# Patient Record
Sex: Male | Born: 1995
Health system: Southern US, Community
[De-identification: ages and names within clinical notes are randomized; demographics above are authoritative.]

## PROBLEM LIST (undated history)

## (undated) DIAGNOSIS — J019 Acute sinusitis, unspecified: Secondary | ICD-10-CM

## (undated) DIAGNOSIS — S83206A Unspecified tear of unspecified meniscus, current injury, right knee, initial encounter: Secondary | ICD-10-CM

## (undated) DIAGNOSIS — R0981 Nasal congestion: Secondary | ICD-10-CM

## (undated) DIAGNOSIS — IMO0001 Reserved for inherently not codable concepts without codable children: Secondary | ICD-10-CM

## (undated) DIAGNOSIS — R05 Cough: Secondary | ICD-10-CM

## (undated) DIAGNOSIS — K200 Eosinophilic esophagitis: Secondary | ICD-10-CM

## (undated) DIAGNOSIS — R058 Other specified cough: Secondary | ICD-10-CM

## (undated) DIAGNOSIS — K219 Gastro-esophageal reflux disease without esophagitis: Secondary | ICD-10-CM

## (undated) HISTORY — PX: CLEFT PALATE REPAIR: SUR1165

## (undated) HISTORY — PX: OTHER SURGICAL HISTORY: SHX169

---
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
## 2016-07-12 ENCOUNTER — Encounter (HOSPITAL_COMMUNITY): Payer: Self-pay | Admitting: Emergency Medicine

## 2016-07-12 ENCOUNTER — Emergency Department (HOSPITAL_COMMUNITY)
Admission: EM | Admit: 2016-07-12 | Discharge: 2016-07-13 | Disposition: A | Discharge status: Home / Self Care | Payer: Commercial Managed Care - HMO | Attending: Emergency Medicine | Admitting: Emergency Medicine

## 2016-07-12 DIAGNOSIS — Z79899 Other long term (current) drug therapy: Secondary | ICD-10-CM | POA: Diagnosis not present

## 2016-07-12 DIAGNOSIS — M79672 Pain in left foot: Secondary | ICD-10-CM | POA: Diagnosis present

## 2016-07-12 DIAGNOSIS — L03116 Cellulitis of left lower limb: Secondary | ICD-10-CM | POA: Insufficient documentation

## 2016-07-12 LAB — CBC WITH DIFFERENTIAL/PLATELET
Basophils Absolute: 0 10*3/uL (ref 0.0–0.1)
Basophils Relative: 0 %
EOS PCT: 2 %
Eosinophils Absolute: 0.2 10*3/uL (ref 0.0–0.7)
HEMATOCRIT: 45.4 % (ref 39.0–52.0)
HEMOGLOBIN: 15.9 g/dL (ref 13.0–17.0)
LYMPHS ABS: 1.1 10*3/uL (ref 0.7–4.0)
LYMPHS PCT: 8 %
MCH: 29.6 pg (ref 26.0–34.0)
MCHC: 35 g/dL (ref 30.0–36.0)
MCV: 84.5 fL (ref 78.0–100.0)
Monocytes Absolute: 0.7 10*3/uL (ref 0.1–1.0)
Monocytes Relative: 5 %
NEUTROS PCT: 85 %
Neutro Abs: 11.1 10*3/uL — ABNORMAL HIGH (ref 1.7–7.7)
Platelets: 215 10*3/uL (ref 150–400)
RBC: 5.37 MIL/uL (ref 4.22–5.81)
RDW: 12.4 % (ref 11.5–15.5)
WBC: 13.2 10*3/uL — AB (ref 4.0–10.5)

## 2016-07-12 LAB — BASIC METABOLIC PANEL
Anion gap: 10 (ref 5–15)
BUN: 11 mg/dL (ref 6–20)
CHLORIDE: 104 mmol/L (ref 101–111)
CO2: 29 mmol/L (ref 22–32)
Calcium: 9.8 mg/dL (ref 8.9–10.3)
Creatinine, Ser: 1.1 mg/dL (ref 0.61–1.24)
GFR calc Af Amer: >60 mL/min (ref >60)
GFR calc non Af Amer: >60 mL/min (ref >60)
GLUCOSE: 110 mg/dL — AB (ref 65–99)
POTASSIUM: 3.7 mmol/L (ref 3.5–5.1)
Sodium: 143 mmol/L (ref 135–145)

## 2016-07-12 MED ORDER — SODIUM CHLORIDE 0.9 % IV BOLUS (SEPSIS)
1000.0000 mL | Freq: Once | INTRAVENOUS | Status: AC
  Administered 2016-07-12: 1000 mL via INTRAVENOUS

## 2016-07-12 MED ORDER — METHYLPREDNISOLONE SODIUM SUCC 125 MG IJ SOLR
INTRAMUSCULAR | Status: AC
  Filled 2016-07-12: qty 2

## 2016-07-12 MED ORDER — DOXYCYCLINE HYCLATE 100 MG IV SOLR
200.0000 mg | Freq: Two times a day (BID) | INTRAVENOUS | Status: DC
  Administered 2016-07-12: 200 mg via INTRAVENOUS
  Filled 2016-07-12 (×2): qty 200

## 2016-07-12 MED ORDER — HYDROXYZINE HCL 50 MG/ML IM SOLN
INTRAMUSCULAR | Status: AC
  Filled 2016-07-12: qty 1

## 2016-07-12 MED ORDER — VANCOMYCIN HCL IN DEXTROSE 1-5 GM/200ML-% IV SOLN
1000.0000 mg | Freq: Once | INTRAVENOUS | Status: AC
  Administered 2016-07-12: 1000 mg via INTRAVENOUS
  Filled 2016-07-12: qty 200

## 2016-07-12 MED ORDER — FAMOTIDINE IN NACL 20-0.9 MG/50ML-% IV SOLN
INTRAVENOUS | Status: DC
Start: 2016-07-12 — End: 2016-07-13
  Filled 2016-07-12: qty 50

## 2016-07-12 MED ORDER — MORPHINE SULFATE (PF) 4 MG/ML IV SOLN
4.0000 mg | Freq: Once | INTRAVENOUS | Status: AC
  Administered 2016-07-12: 4 mg via INTRAVENOUS
  Filled 2016-07-12: qty 1

## 2016-07-12 MED ORDER — ONDANSETRON HCL 4 MG/2ML IJ SOLN
4.0000 mg | Freq: Once | INTRAMUSCULAR | Status: AC
  Administered 2016-07-12: 4 mg via INTRAVENOUS
  Filled 2016-07-12: qty 2

## 2016-07-12 MED ORDER — FAMOTIDINE IN NACL 20-0.9 MG/50ML-% IV SOLN
20.0000 mg | Freq: Once | INTRAVENOUS | Status: AC
  Administered 2016-07-12: 20 mg via INTRAVENOUS

## 2016-07-12 MED ORDER — METHYLPREDNISOLONE SODIUM SUCC 125 MG IJ SOLR
125.0000 mg | Freq: Once | INTRAMUSCULAR | Status: AC
  Administered 2016-07-12: 125 mg via INTRAVENOUS

## 2016-07-12 MED ORDER — DOXYCYCLINE HYCLATE 100 MG IV SOLR
INTRAVENOUS | Status: AC
  Filled 2016-07-12: qty 200

## 2016-07-12 MED ORDER — HYDROXYZINE HCL 50 MG/ML IM SOLN
50.0000 mg | Freq: Once | INTRAMUSCULAR | Status: AC
  Administered 2016-07-12: 50 mg via INTRAMUSCULAR

## 2016-07-12 NOTE — ED Notes (Signed)
Pt reported itching to head- this Clinical research associatewriter noted slight redness to face- spoke of need to watch and that sometimes Vanc would cause redness  Dr Adriana Simasook in to speak with pt and mother and noted redness to chest area- orders to stop Vanc, and ordered meds

## 2016-07-12 NOTE — ED Triage Notes (Signed)
Pt c/o infection to the left foot over one month. Pt also c/o swollen lymph nodes to the left groin with nausea and abd pain.

## 2016-07-12 NOTE — ED Notes (Signed)
Warm blanket given

## 2016-07-12 NOTE — ED Notes (Signed)
Received report on pt, pt updated on plan of care, denies any complaints, update given,

## 2016-07-13 MED ORDER — DOXYCYCLINE HYCLATE 100 MG PO CAPS: 100.0000 mg | ORAL_CAPSULE | Freq: Two times a day (BID) | ORAL | 0 refills | Status: DC

## 2016-07-13 NOTE — ED Notes (Signed)
No reaction noted to medication infusion, pt and family updated

## 2016-07-13 NOTE — ED Provider Notes (Signed)
[facility] Provider Note   CSN: [id number] Arrival date & time: 07/12/16  1936     History   Chief Complaint Chief Complaint  Patient presents with  . Recurrent Skin Infections    HPI Jonathan Mckay is a 21 y.o. male.  Pain in the plantar aspect of the left foot for several weeks, getting worse. 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 He senses a tenderness in his left inguinal area. No fever, sweats, chills. No history of diabetes. Severity of pain is moderate. He has tried nothing at home.      Past Medical History:  Diagnosis Date  . Eosinophilic esophagitis   . GERD (gastroesophageal reflux disease)   . History of Clostridium difficile    2012  . Nasal congestion   . Non-productive cough   . Right knee meniscal tear   . Sinusitis, acute     Patient Active Problem List   Diagnosis Date Noted  . S/P right knee arthroscopy 05/26/2013  . Eosinophilic esophagitis 09/08/2012  . Difficulty swallowing 09/05/2012  . Abdominal pain   . C. difficile diarrhea   . Blood in stool     Past Surgical History:  Procedure Laterality Date  . CLEFT PALATE REPAIR  age 89 mon old  &  416 mon old   and cleft lip repair  . DIRECT LARYNGOSCOPY  08-21-2006   W/  ESOPHAGOSCOPY AND BRONCHOSCOPY AND REMOVAL FORGEIN BODY  . ESOPHAGOGASTRODUODENOSCOPY N/A 06/20/2014   Procedure: ESOPHAGOGASTRODUODENOSCOPY (EGD);  Surgeon: Malissa HippoNajeeb U Rehman, MD;  [facility];  Service: Endoscopy;  Laterality: N/A;  200  . ESOPHAGOSCOPY N/A 09/05/2012   Procedure: ESOPHAGOSCOPY Foreign Body Removal;  Surgeon: Jon GillsJoseph H Clark, MD;  [facility];  Service: Gastroenterology;  Laterality: N/A;  . KNEE ARTHROSCOPY Right 05/26/2013   Procedure: ARTHROSCOPY RIGHT KNEE WITH DEBRIDEMENT  PARTIAL MENISECTOMY REMOVAL OF FOREIGN BODY ;  Surgeon: Eugenia Mcalpineobert Collins, MD;  [facility] [place];  Service: Orthopedics;  Laterality: Right;       Home Medications     Prior to Admission medications   Medication Sig Start Date End Date Taking? Authorizing Provider  EPIPEN 2-PAK 0.3 MG/0.3ML SOAJ injection Inject 0.3 mg into the muscle once.  07/15/14  Yes [provider]  doxycycline (VIBRAMYCIN) 100 MG capsule Take 1 capsule (100 mg total) by mouth 2 (two) times daily. 07/13/16   Donnetta Hutchingook, Romey Cohea, MD    Family History Family History  Problem Relation Age of Onset  . Stroke Other   . Asthma Other   . Cancer Other   . Inflammatory bowel disease Neg Hx     Social History Social History  Substance Use Topics  . Smoking status: Never Smoker  . Smokeless tobacco: Never Used  . Alcohol use No     Allergies   Amoxicillin; Penicillins; Shellfish allergy; Clindamycin/lincomycin; Vancomycin; and Benadryl [diphenhydramine hcl]   Review of Systems Review of Systems  All other systems reviewed and are negative.    Physical Exam Updated Vital Signs BP (!) 108/58   Pulse 64   Temp 97.8 F (36.6 C) (Oral)   Resp 16   Ht 6' (1.829 m)   Wt 75.3 kg (166 lb)   SpO2 98%   BMI 22.51 kg/m   Physical Exam  Constitutional: He is oriented to person, place, and time. He appears well-developed and well-nourished.  HENT:  Head: Normocephalic and atraumatic.  Eyes: Conjunctivae are normal.  Neck: Neck supple.  Cardiovascular: Normal rate and regular rhythm.   Pulmonary/Chest: Effort normal and breath sounds normal.  Abdominal: Soft. Bowel sounds are normal.  Musculoskeletal: Normal range of motion.  Neurological: He is alert and oriented to person, place, and time.  Skin:  Tender left inguinal node. Left foot: Area of cellulitis and superficial abscess on the plantar aspect of the foot around the am TP joint of the third,fourth, and fifth digit.  Psychiatric: He has a normal mood and affect. His behavior is normal.  Nursing note and vitals reviewed.    ED Treatments / Results  Labs (all labs ordered are listed, but only abnormal results  are displayed) Labs Reviewed  CBC WITH DIFFERENTIAL/PLATELET - Abnormal; Notable for the following:       Result Value   WBC 13.2 (*)    Neutro Abs 11.1 (*)    All other components within normal limits  BASIC METABOLIC PANEL - Abnormal; Notable for the following:    Glucose, Bld 110 (*)    All other components within normal limits    EKG  EKG Interpretation None       Radiology No results found.  Procedures .Marland KitchenIncision and Drainage Date/Time: 07/13/2016 3:53 PM Performed by: Donnetta Hutching Authorized by: Donnetta Hutching   Consent:    Consent obtained:  Verbal   Consent given by:  Patient   Risks discussed:  Pain Location:    Type:  Abscess   Location:  Lower extremity   Lower extremity location:  Foot   Foot location:  L foot Pre-procedure details:    Skin preparation:  Betadine Procedure type:    Complexity:  Simple Procedure details:    Needle aspiration: no     Incision types:  Stab incision   Incision depth:  Dermal   Scalpel blade:  11   Wound management:  Irrigated with saline and probed and deloculated   Drainage amount:  Scant   Wound treatment:  Wound left open   Packing materials:  None Post-procedure details:    Patient tolerance of procedure:  Tolerated well, no immediate complications Comments:     No pus expressed from wound.   (including critical care time)  Medications Ordered in ED Medications  vancomycin (VANCOCIN) IVPB 1000 mg/200 mL premix (0 mg Intravenous Stopped 07/12/16 2303)  sodium chloride 0.9 % bolus 1,000 mL (0 mLs Intravenous Stopped 07/13/16 0036)  ondansetron (ZOFRAN) injection 4 mg (4 mg Intravenous Given 07/12/16 2149)  morphine 4 MG/ML injection 4 mg (4 mg Intravenous Given 07/12/16 2147)  methylPREDNISolone sodium succinate (SOLU-MEDROL) 125 mg/2 mL injection 125 mg (125 mg Intravenous Given 07/12/16 2224)  famotidine (PEPCID) IVPB 20 mg premix (0 mg Intravenous Stopped 07/12/16 2312)  hydrOXYzine (VISTARIL) injection 50 mg (50 mg  Intramuscular Given 07/12/16 2227)     Initial Impression / Assessment and Plan / ED Course  I have reviewed the triage vital signs and the nursing notes.  Pertinent labs & imaging results that were available during my care of the patient were reviewed by me and considered in my medical decision making (see chart for details).    Patient has a cellulitis/abscess of his left foot with left inguinal adenopathy. He had an allergic phenomenon to the intravenous vancomycin. This was treated with IV steroids and IV Pepcid.  I then ordered intravenous doxycycline 200 mg. He tolerated this well. Discharge medications doxycycline 100 mg twice a day for 10 days. He understands to return for fever, chills, worsening pain, redness traveling up his  leg. This was discussed with the patient and his mother.   Final Clinical Impressions(s) / ED Diagnoses   Final diagnoses:  Cellulitis of foot, left    New Prescriptions Discharge Medication List as of 07/13/2016 12:12 AM    START taking these medications   Details  doxycycline (VIBRAMYCIN) 100 MG capsule Take 1 capsule (100 mg total) by mouth 2 (two) times daily., Starting Mon 07/13/2016, Print         Donnetta Hutching, MD 07/13/16 1556

## 2016-07-13 NOTE — Discharge Instructions (Signed)
Recommend waterproof boots.  Soak in warm salt water twice a day. Keep your feet very clean. White Socks. Elevate foot tomorrow. Prescription for antibiotic for 10 days.  Tylenol or ibuprofen for pain

## 2016-08-17 ENCOUNTER — Emergency Department (HOSPITAL_COMMUNITY)
Admission: EM | Admit: 2016-08-17 | Discharge: 2016-08-17 | Disposition: A | Discharge status: Home / Self Care | Payer: Commercial Managed Care - HMO | Attending: Emergency Medicine | Admitting: Emergency Medicine

## 2016-08-17 ENCOUNTER — Encounter (HOSPITAL_COMMUNITY): Payer: Self-pay | Admitting: Emergency Medicine

## 2016-08-17 DIAGNOSIS — R21 Rash and other nonspecific skin eruption: Secondary | ICD-10-CM

## 2016-08-17 DIAGNOSIS — L299 Pruritus, unspecified: Secondary | ICD-10-CM | POA: Diagnosis present

## 2016-08-17 DIAGNOSIS — J0181 Other acute recurrent sinusitis: Secondary | ICD-10-CM | POA: Insufficient documentation

## 2016-08-17 DIAGNOSIS — J0191 Acute recurrent sinusitis, unspecified: Secondary | ICD-10-CM

## 2016-08-17 MED ORDER — TRIAMCINOLONE ACETONIDE 0.1 % EX CREA: TOPICAL_CREAM | CUTANEOUS | 0 refills | Status: DC

## 2016-08-17 MED ORDER — DOXYCYCLINE HYCLATE 100 MG PO CAPS: 100.0000 mg | ORAL_CAPSULE | Freq: Two times a day (BID) | ORAL | 0 refills | Status: DC

## 2016-08-17 NOTE — ED Provider Notes (Signed)
[facility] Provider Note   CSN: [id number] Arrival date & time: 08/17/16  1837     History   Chief Complaint Chief Complaint  Patient presents with  . Wound Check    left foot    HPI Jonathan Mckay is a 21 y.o. male.   He is here for evaluation of multiple problems including sinus drainage, ear pain, sore in left axilla, itchy rash on legs, and a lesion on his left foot which has easily been treated.  He had an antibiotic prescription given about a month ago for an infection of his left foot.  He states it got a little better, but then the discomfort returned.  He denies fever, chills, nausea, vomiting, chest pain, shortness of breath, cough or sputum production.  He gets recurrent sinus infections, and is status post corrective surgery, for cleft palate.  There are no other known  modifying factors.   HPI  Past Medical History:  Diagnosis Date  . Eosinophilic esophagitis   . GERD (gastroesophageal reflux disease)   . History of Clostridium difficile    2012  . Nasal congestion   . Non-productive cough   . Right knee meniscal tear   . Sinusitis, acute     Patient Active Problem List   Diagnosis Date Noted  . S/P right knee arthroscopy 05/26/2013  . Eosinophilic esophagitis 09/08/2012  . Difficulty swallowing 09/05/2012  . Abdominal pain   . C. difficile diarrhea   . Blood in stool     Past Surgical History:  Procedure Laterality Date  . CLEFT PALATE REPAIR  age 53 mon old  &  42 mon old   and cleft lip repair  . DIRECT LARYNGOSCOPY  08-21-2006   W/  ESOPHAGOSCOPY AND BRONCHOSCOPY AND REMOVAL FORGEIN BODY  . ESOPHAGOGASTRODUODENOSCOPY N/A 06/20/2014   Procedure: ESOPHAGOGASTRODUODENOSCOPY (EGD);  Surgeon: Malissa Hippo, MD;  [facility];  Service: Endoscopy;  Laterality: N/A;  200  . ESOPHAGOSCOPY N/A 09/05/2012   Procedure: ESOPHAGOSCOPY Foreign Body Removal;  Surgeon: Jon Gills, MD;  Location: [facility] OR;  Service: Gastroenterology;   Laterality: N/A;  . KNEE ARTHROSCOPY Right 05/26/2013   Procedure: ARTHROSCOPY RIGHT KNEE WITH DEBRIDEMENT  PARTIAL MENISECTOMY REMOVAL OF FOREIGN BODY ;  Surgeon: Eugenia Mcalpine, MD;  Location: [facility];  Service: Orthopedics;  Laterality: Right;       Home Medications    Prior to Admission medications   Medication Sig Start Date End Date Taking? Authorizing Provider  doxycycline (VIBRAMYCIN) 100 MG capsule Take 1 capsule (100 mg total) by mouth 2 (two) times daily. 07/13/16   Donnetta Hutching, MD  EPIPEN 2-PAK 0.3 MG/0.3ML SOAJ injection Inject 0.3 mg into the muscle once.  07/15/14   [provider]    Family History Family History  Problem Relation Age of Onset  . Stroke Other   . Asthma Other   . Cancer Other   . Inflammatory bowel disease Neg Hx     Social History Social History  Substance Use Topics  . Smoking status: Never Smoker  . Smokeless tobacco: Never Used  . Alcohol use No     Allergies   Amoxicillin; Penicillins; Shellfish allergy; Clindamycin/lincomycin; Vancomycin; and Benadryl [diphenhydramine hcl]   Review of Systems Review of Systems  All other systems reviewed and are negative.    Physical Exam Updated Vital Signs BP 133/78 (BP Location: Right Arm)   Pulse 73   Temp 97.7 F (36.5 C) (Oral)   Resp  17   Ht 5\' 11"  (1.803 m)   Wt 72.6 kg (160 lb)   SpO2 99%   BMI 22.32 kg/m   Physical Exam  Constitutional: He is oriented to person, place, and time. He appears well-developed and well-nourished. No distress.  HENT:  Head: Normocephalic and atraumatic.  Right Ear: External ear normal.  Left Ear: External ear normal.  Sequela of cleft palate repair.  Visual abnormality of hard palate persists.  No drainage in oropharynx.  No drainage from nose.  TMs normal bilaterally.  Eyes: Pupils are equal, round, and reactive to light. Conjunctivae and EOM are normal.  Neck: Normal range of motion and phonation normal. Neck supple.    Cardiovascular: Normal rate, regular rhythm and normal heart sounds.   Pulmonary/Chest: Effort normal and breath sounds normal. He exhibits no bony tenderness.  Abdominal: Soft. There is no tenderness.  Musculoskeletal: Normal range of motion.  Neurological: He is alert and oriented to person, place, and time. No cranial nerve deficit or sensory deficit. He exhibits normal muscle tone. Coordination normal.  Skin: Skin is warm, dry and intact.  Firm induration left axilla, about 1.5 cm, consistent with full ankle.  Nonspecific rash anterior lower legs, characterized by tiny reddened areas, and excoriations.  Irregular mass left, plantar aspect forefoot, irregular, whitish discoloration, without drainage, bleeding or fluctuance.  Psychiatric: He has a normal mood and affect. His behavior is normal. Judgment and thought content normal.  Nursing note and vitals reviewed.    ED Treatments / Results  Labs (all labs ordered are listed, but only abnormal results are displayed) Labs Reviewed - No data to display  EKG  EKG Interpretation None       Radiology No results found.  Procedures Procedures (including critical care time)  Medications Ordered in ED Medications - No data to display   Initial Impression / Assessment and Plan / ED Course  I have reviewed the triage vital signs and the nursing notes.  Pertinent labs & imaging results that were available during my care of the patient were reviewed by me and considered in my medical decision making (see chart for details).      Patient Vitals for the past 24 hrs:  BP Temp Temp src Pulse Resp SpO2 Height Weight  08/17/16 1841 133/78 97.7 F (36.5 C) Oral 73 17 99 % 5\' 11"  (1.803 m) 72.6 kg (160 lb)    7:36 PM Reevaluation with update and discussion. After initial assessment and treatment, an updated evaluation reveals no change in clinical status.  Findings discussed with the patient and all questions were answered.  Annjanette Wertenberger L   Final Clinical Impressions(s) / ED Diagnoses   Final diagnoses:  None   Patient with multiple complaints, possible recurrent sinus infection, and associated left axilla infection.  No indication for drainage of the left axilla.  Nonspecific finding left foot, persistent for at least a month, etiology is not clear.  Possible infectious process.  This will require further evaluation by a podiatrist with possible biopsy.  It may be related to his chronic need for wearing boots, and working outside.  Nonspecific rash lower legs, may be related to poison ivy, or other nonspecific dermatitis.  Doubt serious bacterial infection or metabolic instability.  Nursing Notes Reviewed/ Care Coordinated Applicable Imaging Reviewed Interpretation of Laboratory Data incorporated into ED treatment  The patient appears reasonably screened and/or stabilized for discharge and I doubt any other medical condition or other [facility] requiring further screening, evaluation, or treatment in  the ED at this time prior to discharge.  Plan: Home Medications-OTC analgesia as needed; Home Treatments-skin cleansing to improve rash, and follicle left axilla; return here if the recommended treatment, does not improve the symptoms; Recommended follow up-PCP as needed.  Consider seeing podiatry for left foot problem.    New Prescriptions New Prescriptions   No medications on file     Mancel BaleWentz, Eliga Arvie, MD 08/17/16 1940

## 2016-08-17 NOTE — Discharge Instructions (Signed)
Follow-up with a primary care doctor for further treatment of the sinus infection and rash.  Consider seeing a podiatrist for the left foot problem.  It may help to get a biopsy of the area to determine exactly what it is caused by.

## 2016-08-20 ENCOUNTER — Encounter: Payer: Self-pay | Admitting: Podiatry

## 2016-08-20 ENCOUNTER — Ambulatory Visit (INDEPENDENT_AMBULATORY_CARE_PROVIDER_SITE_OTHER): Payer: 59 | Admitting: Podiatry

## 2016-08-20 DIAGNOSIS — B353 Tinea pedis: Secondary | ICD-10-CM

## 2016-08-20 MED ORDER — TERBINAFINE HCL 250 MG PO TABS: 250.0000 mg | ORAL_TABLET | Freq: Every day | ORAL | 1 refills | Status: DC

## 2016-08-20 NOTE — Progress Notes (Signed)
   Subjective:    Patient ID: Jonathan Mckay, male    DOB: [birth year], 21 y.o.   MRN: [id number]  HPI: He presents today with a chief complaint of painful plantar forefoot left he was referred by Dr. Adriana Simasook from the emergency department. He had blistered areas on the plantar aspect of the left foot for the past 2 months. He is an been been on 2 rounds of antibiotics. He saw his doctor due to severe leg and groin pain. He states that it itches and burns to refers to the plantar lesions. He decided that he does not want to have a biopsy was absolutely necessary he does relate a painful nodule to his armpit left. He denies any tick bite or any trauma. States that he has to work in [employer] creaks and latex etc.    Review of Systems  HENT: Positive for sinus pressure.   Skin: Positive for rash.  All other systems reviewed and are negative.      Objective:   Physical Exam: Vital signs stable alert and oriented 3. Pulses are palpable. Neurologic system is intact. Deep tendon reflexes are intact. Muscle strength is normal bilateral. Orthopedic evaluation was resolved just distal to the ankle range of motion without crepitation. Tenderness evaluation demonstrates what appears to be vesicular tinea pedis on the plantar aspect of the left foot appears to have started interdigitally and moved proximally. I imagine it was the rupturing of the blisters which she eventually become infected leading to the cellulitis and the groin pain and leg pain. This has resolved now with the doxycycline.        Assessment & Plan:  Assessment: Tinea pedis left foot.  Plan: Start him on a Lamisil regimen 250 mg tablets 1 every day for 1 month.

## 2016-09-01 ENCOUNTER — Telehealth: Payer: Self-pay | Admitting: *Deleted

## 2016-09-01 NOTE — Telephone Encounter (Signed)
Pt states he was seen about 2 weeks ago and given a medication, but now he has the rash on his hands. I told pt to continue the medication Terbinafine for his feet, he has not been on the medication for the full treatment, and to make an appt with his PCP or the facility that works best for him. Pt states he will get in with a doctor.

## 2016-09-10 ENCOUNTER — Ambulatory Visit (INDEPENDENT_AMBULATORY_CARE_PROVIDER_SITE_OTHER): Payer: 59 | Admitting: Podiatry

## 2016-09-10 ENCOUNTER — Encounter: Payer: Self-pay | Admitting: Podiatry

## 2016-09-10 DIAGNOSIS — B353 Tinea pedis: Secondary | ICD-10-CM

## 2016-09-12 NOTE — Progress Notes (Signed)
He presents today for follow-up of his athlete's foot. He states that it seems to be doing much better.  Objective: Vital signs are stable alert and oriented 3. Pulses are palpable.  Plantar aspect of the left foot appears to be healing very nicely everything appears to be drying up I see no signs of new athlete's foot at this point. Interdigital has gone on to heal were waiting for the dry skin to slough off the distal aspect of the foot.  Assessment: Resolving tinea pedis with Lamisil.  Plan: I will this young man to continue Lamisil for at least another month I also talked him about cleaning his shoe gear he understands that is amenable to a follow-up with me as needed.

## 2016-09-24 ENCOUNTER — Emergency Department (HOSPITAL_COMMUNITY): Payer: 59

## 2016-09-24 ENCOUNTER — Emergency Department (HOSPITAL_COMMUNITY)
Admission: EM | Admit: 2016-09-24 | Discharge: 2016-09-25 | Disposition: A | Discharge status: Home / Self Care | Payer: 59 | Attending: Emergency Medicine | Admitting: Emergency Medicine

## 2016-09-24 ENCOUNTER — Encounter (HOSPITAL_COMMUNITY): Payer: Self-pay | Admitting: Emergency Medicine

## 2016-09-24 DIAGNOSIS — R1084 Generalized abdominal pain: Secondary | ICD-10-CM | POA: Insufficient documentation

## 2016-09-24 DIAGNOSIS — Z79899 Other long term (current) drug therapy: Secondary | ICD-10-CM | POA: Insufficient documentation

## 2016-09-24 DIAGNOSIS — K5900 Constipation, unspecified: Secondary | ICD-10-CM | POA: Insufficient documentation

## 2016-09-24 LAB — COMPREHENSIVE METABOLIC PANEL
ALT: 9 U/L — AB (ref 17–63)
ANION GAP: 7 (ref 5–15)
AST: 18 U/L (ref 15–41)
Albumin: 4.7 g/dL (ref 3.5–5.0)
Alkaline Phosphatase: 58 U/L (ref 38–126)
BUN: 18 mg/dL (ref 6–20)
CHLORIDE: 103 mmol/L (ref 101–111)
CO2: 29 mmol/L (ref 22–32)
CREATININE: 1.13 mg/dL (ref 0.61–1.24)
Calcium: 9.5 mg/dL (ref 8.9–10.3)
Glucose, Bld: 93 mg/dL (ref 65–99)
POTASSIUM: 3.8 mmol/L (ref 3.5–5.1)
SODIUM: 139 mmol/L (ref 135–145)
Total Bilirubin: 0.9 mg/dL (ref 0.3–1.2)
Total Protein: 7.2 g/dL (ref 6.5–8.1)

## 2016-09-24 LAB — CBC WITH DIFFERENTIAL/PLATELET
Basophils Absolute: 0 10*3/uL (ref 0.0–0.1)
Basophils Relative: 1 %
EOS ABS: 0.5 10*3/uL (ref 0.0–0.7)
EOS PCT: 8 %
HCT: 44.1 % (ref 39.0–52.0)
Hemoglobin: 15.8 g/dL (ref 13.0–17.0)
LYMPHS ABS: 2.3 10*3/uL (ref 0.7–4.0)
Lymphocytes Relative: 37 %
MCH: 29.9 pg (ref 26.0–34.0)
MCHC: 35.8 g/dL (ref 30.0–36.0)
MCV: 83.5 fL (ref 78.0–100.0)
MONO ABS: 0.4 10*3/uL (ref 0.1–1.0)
Monocytes Relative: 6 %
Neutro Abs: 3.1 10*3/uL (ref 1.7–7.7)
Neutrophils Relative %: 48 %
PLATELETS: 224 10*3/uL (ref 150–400)
RBC: 5.28 MIL/uL (ref 4.22–5.81)
RDW: 12.2 % (ref 11.5–15.5)
WBC: 6.3 10*3/uL (ref 4.0–10.5)

## 2016-09-24 LAB — URINALYSIS, ROUTINE W REFLEX MICROSCOPIC
Bilirubin Urine: NEGATIVE
Glucose, UA: NEGATIVE mg/dL
HGB URINE DIPSTICK: NEGATIVE
Ketones, ur: NEGATIVE mg/dL
LEUKOCYTES UA: NEGATIVE
NITRITE: NEGATIVE
PROTEIN: NEGATIVE mg/dL
SPECIFIC GRAVITY, URINE: 1.026 (ref 1.005–1.030)
pH: 5 (ref 5.0–8.0)

## 2016-09-24 LAB — LIPASE, BLOOD: LIPASE: 25 U/L (ref 11–51)

## 2016-09-24 MED ORDER — IOPAMIDOL (ISOVUE-300) INJECTION 61%
INTRAVENOUS | Status: AC
  Filled 2016-09-24: qty 30

## 2016-09-24 MED ORDER — FLEET ENEMA 7-19 GM/118ML RE ENEM
1.0000 | ENEMA | Freq: Once | RECTAL | Status: AC
  Administered 2016-09-24: 1 via RECTAL

## 2016-09-24 MED ORDER — SODIUM CHLORIDE 0.9 % IV BOLUS (SEPSIS)
1000.0000 mL | Freq: Once | INTRAVENOUS | Status: AC
  Administered 2016-09-24: 1000 mL via INTRAVENOUS

## 2016-09-24 MED ORDER — ONDANSETRON HCL 4 MG/2ML IJ SOLN
4.0000 mg | Freq: Once | INTRAMUSCULAR | Status: DC
  Filled 2016-09-24: qty 2

## 2016-09-24 NOTE — ED Provider Notes (Signed)
[facility] Provider Note   CSN: [id number] Arrival date & time: 09/24/16  1937     History   Chief Complaint Chief Complaint  Patient presents with  . Abdominal Pain    HPI Jonathan Mckay is a 21 y.o. male.  Patient complains of generalized abdominal cramping for 3-4 days with no bowel movement. This is unusual for him. No previous abdominal surgery. He has been eating but then cramping afterwards.Severity of pain is moderate. Nothing makes symptoms better or worse.      Past Medical History:  Diagnosis Date  . Eosinophilic esophagitis   . GERD (gastroesophageal reflux disease)   . History of Clostridium difficile    2012  . Nasal congestion   . Non-productive cough   . Right knee meniscal tear   . Sinusitis, acute     Patient Active Problem List   Diagnosis Date Noted  . S/P right knee arthroscopy 05/26/2013  . Eosinophilic esophagitis 09/08/2012  . Difficulty swallowing 09/05/2012  . Abdominal pain   . C. difficile diarrhea   . Blood in stool     Past Surgical History:  Procedure Laterality Date  . CLEFT PALATE REPAIR  age 89 mon old  &  12 mon old   and cleft lip repair  . DIRECT LARYNGOSCOPY  08-21-2006   W/  ESOPHAGOSCOPY AND BRONCHOSCOPY AND REMOVAL FORGEIN BODY  . ESOPHAGOGASTRODUODENOSCOPY N/A 06/20/2014   Procedure: ESOPHAGOGASTRODUODENOSCOPY (EGD);  Surgeon: Malissa Hippo, MD;  Location[facility];  Service: Endoscopy;  Laterality: N/A;  200  . ESOPHAGOSCOPY N/A 09/05/2012   Procedure: ESOPHAGOSCOPY Foreign Body Removal;  Surgeon: Jon Gills, MD;  Location: [facility] OR;  Service: Gastroenterology;  Laterality: N/A;  . KNEE ARTHROSCOPY Right 05/26/2013   Procedure: ARTHROSCOPY RIGHT KNEE WITH DEBRIDEMENT  PARTIAL MENISECTOMY REMOVAL OF FOREIGN BODY ;  Surgeon: Eugenia Mcalpine, MD;  Location: [facility];  Service: Orthopedics;  Laterality: Right;       Home Medications    Prior to Admission medications   Medication Sig  Start Date End Date Taking? Authorizing Provider  terbinafine (LAMISIL) 250 MG tablet Take 1 tablet (250 mg total) by mouth daily. 08/20/16  Yes Hyatt, Max T, DPM    Family History Family History  Problem Relation Age of Onset  . Stroke Other   . Asthma Other   . Cancer Other   . Inflammatory bowel disease Neg Hx     Social History Social History  Substance Use Topics  . Smoking status: Never Smoker  . Smokeless tobacco: Never Used  . Alcohol use No     Allergies   Amoxicillin; Penicillins; Shellfish allergy; Clindamycin/lincomycin; Vancomycin; and Benadryl [diphenhydramine hcl]   Review of Systems Review of Systems  All other systems reviewed and are negative.    Physical Exam Updated Vital Signs BP 129/70 (BP Location: Right Arm)   Temp 97.8 F (36.6 C) (Oral)   Resp 16   Ht 5\' 11"  (1.803 m)   Wt 70.3 kg (155 lb)   SpO2 100%   BMI 21.62 kg/m   Physical Exam  Constitutional: He is oriented to person, place, and time. He appears well-developed and well-nourished.  HENT:  Head: Normocephalic and atraumatic.  Eyes: Conjunctivae are normal.  Neck: Neck supple.  Cardiovascular: Normal rate and regular rhythm.   Pulmonary/Chest: Effort normal and breath sounds normal.  Abdominal:  Minimal generalized abdominal tenderness  Genitourinary:  Genitourinary Comments: Rectal exam: No masses. No stool palpated.  Musculoskeletal:  Normal range of motion.  Neurological: He is alert and oriented to person, place, and time.  Skin: Skin is warm and dry.  Psychiatric: He has a normal mood and affect. His behavior is normal.  Nursing note and vitals reviewed.    ED Treatments / Results  Labs (all labs ordered are listed, but only abnormal results are displayed) Labs Reviewed  COMPREHENSIVE METABOLIC PANEL - Abnormal; Notable for the following:       Result Value   ALT 9 (*)    All other components within normal limits  CBC WITH DIFFERENTIAL/PLATELET  LIPASE, BLOOD    URINALYSIS, ROUTINE W REFLEX MICROSCOPIC    EKG  EKG Interpretation None       Radiology Dg Abdomen Acute W/chest  Result Date: 09/24/2016 CLINICAL DATA:  Constipation, nausea, lower abdominal pain for 2 days. History of esophagitis, Clostridium difficile. EXAM: DG ABDOMEN ACUTE W/ 1V CHEST COMPARISON:  Chest radiograph September 23, 2013 FINDINGS: Cardiomediastinal silhouette is normal. Lungs are clear, no pleural effusions. No pneumothorax. Soft tissue planes and included osseous structures are unremarkable. Stool distended rectum. Scattered small and large bowel air-fluid levels without bowel distention. No intra-abdominal mass effect, pathologic calcifications or free air. Soft tissue planes and included osseous structures are non-suspicious. IMPRESSION: Normal chest. Stool distended rectum, possible fecal impaction without bowel obstruction. Scattered bowel air-fluid levels seen with mild ileus and, enteritis. Electronically Signed   By: Awilda Metro M.D.   On: 09/24/2016 20:42    Procedures Fecal disimpaction Date/Time: 09/24/2016 11:28 PM Performed by: Donnetta Hutching Authorized by: Donnetta Hutching  Consent: Verbal consent obtained. Risks and benefits: risks, benefits and alternatives were discussed Consent given by: patient Patient understanding: patient states understanding of the procedure being performed Relevant documents: relevant documents present and verified Test results: test results available and properly labeled Imaging studies: imaging studies available Patient identity confirmed: verbally with patient Local anesthesia used: no  Anesthesia: Local anesthesia used: no  Sedation: Patient sedated: no Patient tolerance: Patient tolerated the procedure well with no immediate complications Comments: Digital rectal exam performed. No stool palpated. Fleets enema applied.    (including critical care time)  Medications Ordered in ED Medications  ondansetron (ZOFRAN)  injection 4 mg (4 mg Intravenous Refused 09/24/16 2100)  iopamidol (ISOVUE-300) 61 % injection (not administered)  sodium chloride 0.9 % bolus 1,000 mL (0 mLs Intravenous Stopped 09/24/16 2120)  sodium phosphate (FLEET) 7-19 GM/118ML enema 1 enema (1 enema Rectal Given by Other 09/24/16 2244)     Initial Impression / Assessment and Plan / ED Course  I have reviewed the triage vital signs and the nursing notes.  Pertinent labs & imaging results that were available during my care of the patient were reviewed by me and considered in my medical decision making (see chart for details).     Patient is not normally constipated. Rectal exam reveals no obvious fecal mass. Will obtain CT scan for completeness. Discussed with Dr. Lynelle Doctor  Final Clinical Impressions(s) / ED Diagnoses   Final diagnoses:  Generalized abdominal pain  Constipation, unspecified constipation type    New Prescriptions New Prescriptions   No medications on file     Donnetta Hutching, MD 09/24/16 2354

## 2016-09-24 NOTE — Discharge Instructions (Signed)
CT scan was negative. Recommended increase fluid, fruit, fiber. Over-the-counter products include magnesium citrate, Miralax, Dulcolax

## 2016-09-24 NOTE — ED Triage Notes (Signed)
Pt c/o generalized abd pain that gets worse after he eats and states he has not had a bowel movement in 3 days.

## 2016-09-24 NOTE — ED Notes (Signed)
Pt does not wish to take zofran at this time.

## 2016-09-24 NOTE — ED Provider Notes (Signed)
[facility] Provider Note   CSN: [id number] Arrival date & time: 09/24/16  1937     History   Chief Complaint Chief Complaint  Patient presents with  . Abdominal Pain    HPI Jonathan Mckay is a 21 y.o. male.  Patient presents with no bowel movement for several days with associated abdominal cramping. This is unusual for him. History over-the-counter medications with no relief. No vomiting, fever, sweats, chills.  Severity of symptoms is moderate. Nothing makes symptoms better or worse.      Past Medical History:  Diagnosis Date  . Eosinophilic esophagitis   . GERD (gastroesophageal reflux disease)   . History of Clostridium difficile    2012  . Nasal congestion   . Non-productive cough   . Right knee meniscal tear   . Sinusitis, acute     Patient Active Problem List   Diagnosis Date Noted  . S/P right knee arthroscopy 05/26/2013  . Eosinophilic esophagitis 09/08/2012  . Difficulty swallowing 09/05/2012  . Abdominal pain   . C. difficile diarrhea   . Blood in stool     Past Surgical History:  Procedure Laterality Date  . CLEFT PALATE REPAIR  age 29 mon old  &  69 mon old   and cleft lip repair  . DIRECT LARYNGOSCOPY  08-21-2006   W/  ESOPHAGOSCOPY AND BRONCHOSCOPY AND REMOVAL FORGEIN BODY  . ESOPHAGOGASTRODUODENOSCOPY N/A 06/20/2014   Procedure: ESOPHAGOGASTRODUODENOSCOPY (EGD);  Surgeon: Malissa Hippo, MD;  Location[facility];  Service: Endoscopy;  Laterality: N/A;  200  . ESOPHAGOSCOPY N/A 09/05/2012   Procedure: ESOPHAGOSCOPY Foreign Body Removal;  Surgeon: Jon Gills, MD;  Location: [facility] OR;  Service: Gastroenterology;  Laterality: N/A;  . KNEE ARTHROSCOPY Right 05/26/2013   Procedure: ARTHROSCOPY RIGHT KNEE WITH DEBRIDEMENT  PARTIAL MENISECTOMY REMOVAL OF FOREIGN BODY ;  Surgeon: Eugenia Mcalpine, MD;  Location: [facility];  Service: Orthopedics;  Laterality: Right;       Home Medications    Prior to Admission medications     Medication Sig Start Date End Date Taking? Authorizing Provider  terbinafine (LAMISIL) 250 MG tablet Take 1 tablet (250 mg total) by mouth daily. 08/20/16  Yes Hyatt, Max T, DPM    Family History Family History  Problem Relation Age of Onset  . Stroke Other   . Asthma Other   . Cancer Other   . Inflammatory bowel disease Neg Hx     Social History Social History  Substance Use Topics  . Smoking status: Never Smoker  . Smokeless tobacco: Never Used  . Alcohol use No     Allergies   Amoxicillin; Penicillins; Shellfish allergy; Clindamycin/lincomycin; Vancomycin; and Benadryl [diphenhydramine hcl]   Review of Systems Review of Systems  All other systems reviewed and are negative.    Physical Exam Updated Vital Signs BP 129/70 (BP Location: Right Arm)   Temp 97.8 F (36.6 C) (Oral)   Resp 16   Ht 5\' 11"  (1.803 m)   Wt 70.3 kg (155 lb)   SpO2 100%   BMI 21.62 kg/m   Physical Exam  Constitutional: He is oriented to person, place, and time. He appears well-developed and well-nourished.  HENT:  Head: Normocephalic and atraumatic.  Eyes: Conjunctivae are normal.  Neck: Neck supple.  Cardiovascular: Normal rate and regular rhythm.   Pulmonary/Chest: Effort normal and breath sounds normal.  Abdominal:  Minimal generalized abdominal tenderness.  Genitourinary:  Genitourinary Comments: Rectal exam performed: No masses or stool.  Fleets enema applied.  Musculoskeletal: Normal range of motion.  Neurological: He is alert and oriented to person, place, and time.  Skin: Skin is warm and dry.  Psychiatric: He has a normal mood and affect. His behavior is normal.  Nursing note and vitals reviewed.    ED Treatments / Results  Labs (all labs ordered are listed, but only abnormal results are displayed) Labs Reviewed  COMPREHENSIVE METABOLIC PANEL - Abnormal; Notable for the following:       Result Value   ALT 9 (*)    All other components within normal limits  CBC WITH  DIFFERENTIAL/PLATELET  LIPASE, BLOOD  URINALYSIS, ROUTINE W REFLEX MICROSCOPIC    EKG  EKG Interpretation None       Radiology Dg Abdomen Acute W/chest  Result Date: 09/24/2016 CLINICAL DATA:  Constipation, nausea, lower abdominal pain for 2 days. History of esophagitis, Clostridium difficile. EXAM: DG ABDOMEN ACUTE W/ 1V CHEST COMPARISON:  Chest radiograph September 23, 2013 FINDINGS: Cardiomediastinal silhouette is normal. Lungs are clear, no pleural effusions. No pneumothorax. Soft tissue planes and included osseous structures are unremarkable. Stool distended rectum. Scattered small and large bowel air-fluid levels without bowel distention. No intra-abdominal mass effect, pathologic calcifications or free air. Soft tissue planes and included osseous structures are non-suspicious. IMPRESSION: Normal chest. Stool distended rectum, possible fecal impaction without bowel obstruction. Scattered bowel air-fluid levels seen with mild ileus and, enteritis. Electronically Signed   By: Awilda Metro M.D.   On: 09/24/2016 20:42    Procedures Procedures (including critical care time)  Medications Ordered in ED Medications  ondansetron (ZOFRAN) injection 4 mg (4 mg Intravenous Refused 09/24/16 2100)  iopamidol (ISOVUE-300) 61 % injection (not administered)  sodium chloride 0.9 % bolus 1,000 mL (0 mLs Intravenous Stopped 09/24/16 2120)  sodium phosphate (FLEET) 7-19 GM/118ML enema 1 enema (1 enema Rectal Given by Other 09/24/16 2244)     Initial Impression / Assessment and Plan / ED Course  I have reviewed the triage vital signs and the nursing notes.  Pertinent labs & imaging results that were available during my care of the patient were reviewed by me and considered in my medical decision making (see chart for details).     No obvious acute abdomen. CT abdomen pelvis shows a diffusely fluid-filled: And only minimally formed stool distally. No obvious colonic wall thickening or pericolonic  edema.  Patient will be treated as an outpatient.  Final Clinical Impressions(s) / ED Diagnoses   Final diagnoses:  Generalized abdominal pain  Constipation, unspecified constipation type    New Prescriptions New Prescriptions   No medications on file     Donnetta Hutching, MD 10/01/16 1401

## 2016-09-25 NOTE — ED Notes (Signed)
Pt returned from ct,  

## 2016-09-25 NOTE — ED Provider Notes (Signed)
Patient presented with crampy abdominal pain and no bowel movement for 3 days. He told me he has had nausea without vomiting and since he's been in the ED he's had 3 liquid stools. He was left at change of shift to get results of his CT scan of his abdomen/pelvis. Patient states he's feeling better. He states he's having good urinary output. He states he does not feel dehydrated.  Results for orders placed or performed during the hospital encounter of 09/24/16  CBC with Differential  Result Value Ref Range   WBC 6.3 4.0 - 10.5 K/uL   RBC 5.28 4.22 - 5.81 MIL/uL   Hemoglobin 15.8 13.0 - 17.0 g/dL   HCT 40.9 81.1 - 91.4 %   MCV 83.5 78.0 - 100.0 fL   MCH 29.9 26.0 - 34.0 pg   MCHC 35.8 30.0 - 36.0 g/dL   RDW 78.2 95.6 - 21.3 %   Platelets 224 150 - 400 K/uL   Neutrophils Relative % 48 %   Neutro Abs 3.1 1.7 - 7.7 K/uL   Lymphocytes Relative 37 %   Lymphs Abs 2.3 0.7 - 4.0 K/uL   Monocytes Relative 6 %   Monocytes Absolute 0.4 0.1 - 1.0 K/uL   Eosinophils Relative 8 %   Eosinophils Absolute 0.5 0.0 - 0.7 K/uL   Basophils Relative 1 %   Basophils Absolute 0.0 0.0 - 0.1 K/uL  Comprehensive metabolic panel  Result Value Ref Range   Sodium 139 135 - 145 mmol/L   Potassium 3.8 3.5 - 5.1 mmol/L   Chloride 103 101 - 111 mmol/L   CO2 29 22 - 32 mmol/L   Glucose, Bld 93 65 - 99 mg/dL   BUN 18 6 - 20 mg/dL   Creatinine, Ser 0.86 0.61 - 1.24 mg/dL   Calcium 9.5 8.9 - 57.8 mg/dL   Total Protein 7.2 6.5 - 8.1 g/dL   Albumin 4.7 3.5 - 5.0 g/dL   AST 18 15 - 41 U/L   ALT 9 (L) 17 - 63 U/L   Alkaline Phosphatase 58 38 - 126 U/L   Total Bilirubin 0.9 0.3 - 1.2 mg/dL   GFR calc non Af Amer >60 >60 mL/min   GFR calc Af Amer >60 >60 mL/min   Anion gap 7 5 - 15  Lipase, blood  Result Value Ref Range   Lipase 25 11 - 51 U/L  Urinalysis, Routine w reflex microscopic  Result Value Ref Range   Color, Urine YELLOW YELLOW   APPearance CLEAR CLEAR   Specific Gravity, Urine 1.026 1.005 - 1.030    pH 5.0 5.0 - 8.0   Glucose, UA NEGATIVE NEGATIVE mg/dL   Hgb urine dipstick NEGATIVE NEGATIVE   Bilirubin Urine NEGATIVE NEGATIVE   Ketones, ur NEGATIVE NEGATIVE mg/dL   Protein, ur NEGATIVE NEGATIVE mg/dL   Nitrite NEGATIVE NEGATIVE   Leukocytes, UA NEGATIVE NEGATIVE   Laboratory interpretation all normal    Ct Abdomen Pelvis Wo Contrast  Result Date: 09/25/2016 CLINICAL DATA:  Generalized abdominal pain and cramping. Constipation. EXAM: CT ABDOMEN AND PELVIS WITHOUT CONTRAST TECHNIQUE: Multidetector CT imaging of the abdomen and pelvis was performed following the standard protocol without IV contrast. COMPARISON:  Abdominal radiographs yesterday. FINDINGS: Lower chest: Lung bases are clear. Hepatobiliary: No focal hepatic lesion allowing for lack contrast. Gallbladder is decompressed. No calcified stone. No biliary dilatation. Pancreas: No ductal dilatation or inflammation. Spleen: Upper normal in size spanning 13 cm craniocaudal. Adrenals/Urinary Tract: Normal adrenal glands. No hydronephrosis or perinephric  edema. Cyst in the upper right kidney measures 17 mm. Urinary bladder is physiologically distended. No bladder wall thickening. Stomach/Bowel: Stomach physiologically distended. No small bowel dilatation wall thickening or inflammation. Normal appendix. The colon is fluid-filled with air-fluid levels. Minimal formed stool in the rectum, otherwise no formed stool. No definite colonic wall thickening, adjacent fluid partially obscures evaluation. Adjacent fluid partially limits evaluation. No pericolonic edema. Normal appendix. Vascular/Lymphatic: Small mesenteric nodes, not enlarged by size criteria. No retroperitoneal adenopathy. Normal caliber abdominal aorta. Reproductive: Prostate is unremarkable. Other: No free air, free fluid, or intra-abdominal fluid collection. Musculoskeletal: Bilateral L5 pars interarticularis defects with grade 1 anterolisthesis of L5 on S1. There are no acute or  suspicious osseous abnormalities. IMPRESSION: 1. Diffusely fluid-filled colon, only minimal formed stool distally. No definite colonic wall thickening or pericolonic edema. Findings consistent with diarrheal process. No bowel obstruction. 2. Borderline splenomegaly. Electronically Signed   By: Rubye Oaks M.D.   On: 09/25/2016 01:28   Dg Abdomen Acute W/chest  Result Date: 09/24/2016 CLINICAL DATA:  Constipation, nausea, lower abdominal pain for 2 days. History of esophagitis, Clostridium difficile. EXAM: DG ABDOMEN ACUTE W/ 1V CHEST COMPARISON:  Chest radiograph September 23, 2013 FINDINGS: Cardiomediastinal silhouette is normal. Lungs are clear, no pleural effusions. No pneumothorax. Soft tissue planes and included osseous structures are unremarkable. Stool distended rectum. Scattered small and large bowel air-fluid levels without bowel distention. No intra-abdominal mass effect, pathologic calcifications or free air. Soft tissue planes and included osseous structures are non-suspicious. IMPRESSION: Normal chest. Stool distended rectum, possible fecal impaction without bowel obstruction. Scattered bowel air-fluid levels seen with mild ileus and, enteritis. Electronically Signed   By: Awilda Metro M.D.   On: 09/24/2016 20:42   Diagnoses that have been ruled out:  None  Diagnoses that are still under consideration:  None  Final diagnoses:  Generalized abdominal pain  Constipation, unspecified constipation type    Plan discharge  Devoria Albe, MD, Concha Pyo, MD 09/25/16 [phone]

## 2016-09-27 ENCOUNTER — Emergency Department (HOSPITAL_COMMUNITY): Payer: 59

## 2016-09-27 ENCOUNTER — Emergency Department (HOSPITAL_COMMUNITY)
Admission: EM | Admit: 2016-09-27 | Discharge: 2016-09-27 | Disposition: A | Discharge status: Home / Self Care | Payer: 59 | Attending: Emergency Medicine | Admitting: Emergency Medicine

## 2016-09-27 ENCOUNTER — Encounter (HOSPITAL_COMMUNITY): Payer: Self-pay | Admitting: *Deleted

## 2016-09-27 DIAGNOSIS — T07XXXA Unspecified multiple injuries, initial encounter: Secondary | ICD-10-CM

## 2016-09-27 DIAGNOSIS — Y999 Unspecified external cause status: Secondary | ICD-10-CM | POA: Insufficient documentation

## 2016-09-27 DIAGNOSIS — S299XXA Unspecified injury of thorax, initial encounter: Secondary | ICD-10-CM | POA: Diagnosis not present

## 2016-09-27 DIAGNOSIS — R52 Pain, unspecified: Secondary | ICD-10-CM

## 2016-09-27 DIAGNOSIS — S161XXA Strain of muscle, fascia and tendon at neck level, initial encounter: Secondary | ICD-10-CM | POA: Diagnosis not present

## 2016-09-27 DIAGNOSIS — Y929 Unspecified place or not applicable: Secondary | ICD-10-CM | POA: Insufficient documentation

## 2016-09-27 DIAGNOSIS — Z79899 Other long term (current) drug therapy: Secondary | ICD-10-CM | POA: Diagnosis not present

## 2016-09-27 DIAGNOSIS — Y939 Activity, unspecified: Secondary | ICD-10-CM | POA: Insufficient documentation

## 2016-09-27 DIAGNOSIS — S0990XA Unspecified injury of head, initial encounter: Secondary | ICD-10-CM | POA: Diagnosis present

## 2016-09-27 DIAGNOSIS — S86912A Strain of unspecified muscle(s) and tendon(s) at lower leg level, left leg, initial encounter: Secondary | ICD-10-CM | POA: Insufficient documentation

## 2016-09-27 DIAGNOSIS — Z23 Encounter for immunization: Secondary | ICD-10-CM | POA: Diagnosis not present

## 2016-09-27 DIAGNOSIS — S0993XA Unspecified injury of face, initial encounter: Secondary | ICD-10-CM

## 2016-09-27 DIAGNOSIS — T148XXA Other injury of unspecified body region, initial encounter: Secondary | ICD-10-CM | POA: Diagnosis not present

## 2016-09-27 DIAGNOSIS — S060X1A Concussion with loss of consciousness of 30 minutes or less, initial encounter: Secondary | ICD-10-CM | POA: Diagnosis not present

## 2016-09-27 DIAGNOSIS — S02601A Fracture of unspecified part of body of right mandible, initial encounter for closed fracture: Secondary | ICD-10-CM | POA: Diagnosis not present

## 2016-09-27 DIAGNOSIS — S838X2A Sprain of other specified parts of left knee, initial encounter: Secondary | ICD-10-CM

## 2016-09-27 DIAGNOSIS — S298XXA Other specified injuries of thorax, initial encounter: Secondary | ICD-10-CM

## 2016-09-27 MED ORDER — ONDANSETRON HCL 4 MG/2ML IJ SOLN
4.0000 mg | Freq: Once | INTRAMUSCULAR | Status: AC
Start: 2016-09-27 — End: 2016-09-27
  Administered 2016-09-27: 4 mg via INTRAVENOUS
  Filled 2016-09-27: qty 2

## 2016-09-27 MED ORDER — MORPHINE SULFATE (PF) 4 MG/ML IV SOLN
4.0000 mg | Freq: Once | INTRAVENOUS | Status: AC
  Administered 2016-09-27: 4 mg via INTRAVENOUS
  Filled 2016-09-27: qty 1

## 2016-09-27 MED ORDER — DEXTROSE 5 % IV SOLN
100.0000 mg | Freq: Once | INTRAVENOUS | Status: AC
  Administered 2016-09-27: 100 mg via INTRAVENOUS
  Filled 2016-09-27: qty 100

## 2016-09-27 MED ORDER — MORPHINE SULFATE (PF) 2 MG/ML IV SOLN
2.0000 mg | Freq: Once | INTRAVENOUS | Status: AC
  Administered 2016-09-27: 2 mg via INTRAVENOUS
  Filled 2016-09-27: qty 1

## 2016-09-27 MED ORDER — FENTANYL CITRATE (PF) 100 MCG/2ML IJ SOLN
50.0000 ug | Freq: Once | INTRAMUSCULAR | Status: AC
  Administered 2016-09-27: 50 ug via INTRAVENOUS
  Filled 2016-09-27: qty 2

## 2016-09-27 MED ORDER — TETANUS-DIPHTH-ACELL PERTUSSIS 5-2.5-18.5 LF-MCG/0.5 IM SUSP
0.5000 mL | Freq: Once | INTRAMUSCULAR | Status: AC
  Administered 2016-09-27: 0.5 mL via INTRAMUSCULAR
  Filled 2016-09-27: qty 0.5

## 2016-09-27 MED ORDER — ONDANSETRON HCL 4 MG/2ML IJ SOLN
4.0000 mg | Freq: Once | INTRAMUSCULAR | Status: AC
  Administered 2016-09-27: 4 mg via INTRAVENOUS
  Filled 2016-09-27: qty 2

## 2016-09-27 MED ORDER — DOXYCYCLINE HYCLATE 100 MG IV SOLR
INTRAVENOUS | Status: AC
  Filled 2016-09-27: qty 100

## 2016-09-27 NOTE — ED Notes (Signed)
No deformities noted pt is bleeding from what appears to be is lip
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
## 2016-09-27 NOTE — Discharge Instructions (Signed)
GO DIRECTLY TO [facility] TO SEE DR POWERS WITH ORAL SURGERY  [address] [place], [place] [address]
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
## 2016-09-27 NOTE — ED Notes (Signed)
Report called to [facility] ER charge nurse.

## 2016-09-27 NOTE — ED Provider Notes (Signed)
Patient understands that he will have to wait several hrs in the [facility] ER and may not have operative management until 8/27 Patient/family understand this option They will still go to [facility] immediately    Zadie Rhine, MD 09/27/16 [phone]

## 2016-12-24 ENCOUNTER — Emergency Department (HOSPITAL_COMMUNITY)
Admission: EM | Admit: 2016-12-24 | Discharge: 2016-12-24 | Disposition: A | Discharge status: Home / Self Care | Payer: 59 | Attending: Emergency Medicine | Admitting: Emergency Medicine

## 2016-12-24 ENCOUNTER — Encounter (HOSPITAL_COMMUNITY): Payer: Self-pay | Admitting: *Deleted

## 2016-12-24 ENCOUNTER — Other Ambulatory Visit: Payer: Self-pay

## 2016-12-24 DIAGNOSIS — Z79899 Other long term (current) drug therapy: Secondary | ICD-10-CM | POA: Diagnosis not present

## 2016-12-24 DIAGNOSIS — N341 Nonspecific urethritis: Secondary | ICD-10-CM | POA: Diagnosis not present

## 2016-12-24 DIAGNOSIS — R300 Dysuria: Secondary | ICD-10-CM | POA: Diagnosis present

## 2016-12-24 DIAGNOSIS — N342 Other urethritis: Secondary | ICD-10-CM

## 2016-12-24 LAB — URINALYSIS, ROUTINE W REFLEX MICROSCOPIC
BILIRUBIN URINE: NEGATIVE
Glucose, UA: NEGATIVE mg/dL
Hgb urine dipstick: NEGATIVE
KETONES UR: NEGATIVE mg/dL
LEUKOCYTES UA: NEGATIVE
Nitrite: NEGATIVE
PH: 6 (ref 5.0–8.0)
Protein, ur: NEGATIVE mg/dL
Specific Gravity, Urine: 1.02 (ref 1.005–1.030)

## 2016-12-24 MED ORDER — AZITHROMYCIN 250 MG PO TABS
1000.0000 mg | ORAL_TABLET | Freq: Once | ORAL | Status: AC
  Administered 2016-12-24: 1000 mg via ORAL
  Filled 2016-12-24: qty 4

## 2016-12-24 MED ORDER — CEFTRIAXONE SODIUM 250 MG IJ SOLR
250.0000 mg | Freq: Once | INTRAMUSCULAR | Status: AC
  Administered 2016-12-24: 250 mg via INTRAMUSCULAR
  Filled 2016-12-24: qty 250

## 2016-12-24 MED ORDER — ONDANSETRON 4 MG PO TBDP
4.0000 mg | ORAL_TABLET | Freq: Once | ORAL | Status: AC
  Administered 2016-12-24: 4 mg via ORAL
  Filled 2016-12-24: qty 1

## 2016-12-24 MED ORDER — STERILE WATER FOR INJECTION IJ SOLN
INTRAMUSCULAR | Status: AC
  Administered 2016-12-24: 2 mL
  Filled 2016-12-24: qty 10

## 2016-12-24 MED ORDER — METRONIDAZOLE 500 MG PO TABS
2000.0000 mg | ORAL_TABLET | Freq: Once | ORAL | Status: AC
  Administered 2016-12-24: 2000 mg via ORAL
  Filled 2016-12-24: qty 4

## 2016-12-24 NOTE — ED Triage Notes (Signed)
Pt reports several days of painful urination along with right side back pain and groin pain.

## 2016-12-24 NOTE — ED Provider Notes (Signed)
[facility] Provider Note   CSN: [id number] Arrival date & time: 12/24/16  1507     History   Chief Complaint Chief Complaint  Patient presents with  . Dysuria  . Back Pain    HPI Jonathan Mckay is a 21 y.o. male.  The history is provided by the patient.  Dysuria   This is a new problem. Episode onset: 4 days. The problem occurs intermittently. The problem has been gradually improving. The quality of the pain is described as burning. The pain is moderate. There has been no fever. He is sexually active. There is no history of pyelonephritis. Associated symptoms include flank pain. Pertinent negatives include no chills, no nausea, no vomiting, no discharge, no frequency and no hematuria. He has tried nothing for the symptoms. His past medical history does not include kidney stones or catheterization.    Past Medical History:  Diagnosis Date  . Eosinophilic esophagitis   . GERD (gastroesophageal reflux disease)   . History of Clostridium difficile    2012  . Nasal congestion   . Non-productive cough   . Right knee meniscal tear   . Sinusitis, acute     Patient Active Problem List   Diagnosis Date Noted  . S/P right knee arthroscopy 05/26/2013  . Eosinophilic esophagitis 09/08/2012  . Difficulty swallowing 09/05/2012  . Abdominal pain   . C. difficile diarrhea   . Blood in stool     Past Surgical History:  Procedure Laterality Date  . CLEFT PALATE REPAIR  age 89 mon old  &  89 mon old   and cleft lip repair  . DIRECT LARYNGOSCOPY  08-21-2006   W/  ESOPHAGOSCOPY AND BRONCHOSCOPY AND REMOVAL FORGEIN BODY  . ESOPHAGOGASTRODUODENOSCOPY N/A 06/20/2014   Procedure: ESOPHAGOGASTRODUODENOSCOPY (EGD);  Surgeon: Malissa HippoNajeeb U Rehman, MD;  [facility];  Service: Endoscopy;  Laterality: N/A;  200  . ESOPHAGOSCOPY N/A 09/05/2012   Procedure: ESOPHAGOSCOPY Foreign Body Removal;  Surgeon: Jon GillsJoseph H Clark, MD;  [facility];  Service:  Gastroenterology;  Laterality: N/A;  . KNEE ARTHROSCOPY Right 05/26/2013   Procedure: ARTHROSCOPY RIGHT KNEE WITH DEBRIDEMENT  PARTIAL MENISECTOMY REMOVAL OF FOREIGN BODY ;  Surgeon: Eugenia Mcalpineobert Collins, MD;  [facility];  Service: Orthopedics;  Laterality: Right;       Home Medications    Prior to Admission medications   Medication Sig Start Date End Date Taking? Authorizing Provider  terbinafine (LAMISIL) 250 MG tablet Take 1 tablet (250 mg total) by mouth daily. 08/20/16   Hyatt, Max T, DPM    Family History Family History  Problem Relation Age of Onset  . Stroke Other   . Asthma Other   . Cancer Other   . Inflammatory bowel disease Neg Hx     Social History Social History   Tobacco Use  . Smoking status: Never Smoker  . Smokeless tobacco: Never Used  Substance Use Topics  . Alcohol use: No  . Drug use: No     Allergies   Amoxicillin; Penicillins; Shellfish allergy; Clindamycin/lincomycin; Vancomycin; and Benadryl [diphenhydramine hcl]   Review of Systems Review of Systems  Constitutional: Negative for chills and fever.  HENT: Negative for ear pain and sore throat.   Eyes: Negative for pain and visual disturbance.  Respiratory: Negative for cough and shortness of breath.   Cardiovascular: Negative for chest pain and palpitations.  Gastrointestinal: Negative for abdominal pain, nausea and vomiting.  Genitourinary: Positive for dysuria and  flank pain. Negative for discharge, frequency, hematuria, penile pain and testicular pain.  Musculoskeletal: Negative for arthralgias and back pain.  Skin: Negative for color change and rash.  Neurological: Negative for seizures and syncope.  All other systems reviewed and are negative.    Physical Exam Updated Vital Signs BP 126/79   Pulse 65   Temp 98.1 F (36.7 C) (Oral)   Resp 18   SpO2 99%   Physical Exam  Constitutional: He appears well-developed and well-nourished.  HENT:  Head:  Normocephalic and atraumatic.  Eyes: Conjunctivae are normal.  Neck: Neck supple.  Cardiovascular: Normal rate and regular rhythm.  No murmur heard. Pulmonary/Chest: Effort normal and breath sounds normal. No respiratory distress.  Abdominal: Soft. He exhibits no distension. There is no tenderness. There is no rigidity, no rebound, no guarding, no CVA tenderness, no tenderness at McBurney's point and negative Murphy's sign.  Genitourinary: Testes normal and penis normal. Right testis shows no swelling and no tenderness. Left testis shows no swelling and no tenderness. Circumcised. No penile erythema or penile tenderness. No discharge found.  Musculoskeletal: He exhibits no edema.  Neurological: He is alert.  Skin: Skin is warm and dry.  Psychiatric: He has a normal mood and affect.  Nursing note and vitals reviewed.    ED Treatments / Results  Labs (all labs ordered are listed, but only abnormal results are displayed) Labs Reviewed  URINALYSIS, ROUTINE W REFLEX MICROSCOPIC  GC/CHLAMYDIA PROBE AMP ([place]) NOT [facility]    EKG  EKG Interpretation None       Radiology No results found.  Procedures Procedures (including critical care time)  Medications Ordered in ED Medications  azithromycin (ZITHROMAX) tablet 1,000 mg (1,000 mg Oral Given 12/24/16 1648)  cefTRIAXone (ROCEPHIN) injection 250 mg (250 mg Intramuscular Given 12/24/16 1648)  metroNIDAZOLE (FLAGYL) tablet 2,000 mg (2,000 mg Oral Given 12/24/16 1648)  ondansetron (ZOFRAN-ODT) disintegrating tablet 4 mg (4 mg Oral Given 12/24/16 1648)  sterile water (preservative free) injection (2 mLs  Given 12/24/16 1652)     Initial Impression / Assessment and Plan / ED Course  I have reviewed the triage vital signs and the nursing notes.  Pertinent labs & imaging results that were available during my care of the patient were reviewed by me and considered in my medical decision making (see chart for details).      21 year old male with no pertinent past medical history presenting with 4 days of dysuria and lower back pain.  Denies fevers.  Denies penile discharge but is sexually active with women.  No CVA tenderness.  Benign abdomen.  Penile and testicular exam normal.  Patient is worried about STDs as he has had sexual activity with multiple females.  UA negative for signs of UTI.  Low concern for nephrolithiasis as he has no CVA tenderness and no abdominal pain no hematuria at this time.  Patient would like to be tested and treated for STDs.  GC chlamydia swab sent.  Will treat with Rocephin as he has had Rocephin in the past even though he has a penicillin allergy, azithromycin and Flagyl.  Patient amenable with this plan.  Strict return precautions given.  Final Clinical Impressions(s) / ED Diagnoses   Final diagnoses:  Urethritis    ED Discharge Orders    None       Allyn Bertoni Italyhad, MD 12/24/16 1723    Melene PlanFloyd, Dan, DO 12/24/16 1734

## 2016-12-25 LAB — GC/CHLAMYDIA PROBE AMP (~~LOC~~) NOT AT ARMC
CHLAMYDIA, DNA PROBE: NEGATIVE
Neisseria Gonorrhea: NEGATIVE

## 2017-02-24 DIAGNOSIS — J069 Acute upper respiratory infection, unspecified: Secondary | ICD-10-CM | POA: Diagnosis not present

## 2017-04-24 DIAGNOSIS — S0081XA Abrasion of other part of head, initial encounter: Secondary | ICD-10-CM | POA: Diagnosis not present

## 2017-04-24 DIAGNOSIS — S0083XA Contusion of other part of head, initial encounter: Secondary | ICD-10-CM | POA: Diagnosis not present

## 2017-04-24 DIAGNOSIS — S01131A Puncture wound without foreign body of right eyelid and periocular area, initial encounter: Secondary | ICD-10-CM | POA: Diagnosis not present

## 2017-04-29 ENCOUNTER — Encounter (HOSPITAL_COMMUNITY): Payer: Self-pay

## 2017-04-29 ENCOUNTER — Emergency Department (HOSPITAL_COMMUNITY)
Admission: EM | Admit: 2017-04-29 | Discharge: 2017-04-29 | Disposition: A | Discharge status: Home / Self Care | Payer: 59 | Attending: Physician Assistant | Admitting: Physician Assistant

## 2017-04-29 ENCOUNTER — Other Ambulatory Visit: Payer: Self-pay

## 2017-04-29 DIAGNOSIS — Z79899 Other long term (current) drug therapy: Secondary | ICD-10-CM | POA: Diagnosis not present

## 2017-04-29 DIAGNOSIS — R300 Dysuria: Secondary | ICD-10-CM | POA: Diagnosis present

## 2017-04-29 DIAGNOSIS — Z113 Encounter for screening for infections with a predominantly sexual mode of transmission: Secondary | ICD-10-CM | POA: Diagnosis not present

## 2017-04-29 DIAGNOSIS — N342 Other urethritis: Secondary | ICD-10-CM | POA: Diagnosis not present

## 2017-04-29 MED ORDER — AZITHROMYCIN 250 MG PO TABS: 2000.0000 mg | ORAL_TABLET | Freq: Once | ORAL | Status: DC

## 2017-04-29 MED ORDER — CEFTRIAXONE SODIUM 250 MG IJ SOLR: 250.0000 mg | INTRAMUSCULAR | Status: DC

## 2017-04-29 MED ORDER — AZITHROMYCIN 250 MG PO TABS
1000.0000 mg | ORAL_TABLET | Freq: Once | ORAL | Status: AC
  Administered 2017-04-29: 1000 mg via ORAL
  Filled 2017-04-29: qty 4

## 2017-04-29 MED ORDER — CEFTRIAXONE SODIUM 250 MG IJ SOLR
250.0000 mg | Freq: Once | INTRAMUSCULAR | Status: AC
  Administered 2017-04-29: 250 mg via INTRAMUSCULAR
  Filled 2017-04-29: qty 250

## 2017-04-29 MED ORDER — GENTAMICIN SULFATE 40 MG/ML IJ SOLN: 240.0000 mg | Freq: Once | INTRAMUSCULAR | Status: DC

## 2017-04-29 NOTE — Discharge Instructions (Addendum)
You were not tested for all STDs today. Your gonorrhea and chlamydia tests are pending- if they are positive, you will receive a phone call. Refrain from sex until you have the results from a full STD screen. Please go to [facility] (Address: [address], [place], [place] [address] Phone: [phone]) or see the [facility] (Address: [address] Phone: [phone]) for full STD screening. Return to the emergency room for worsening of symptoms, fever, and vomiting. ° °

## 2017-04-29 NOTE — ED Provider Notes (Signed)
[facility] EMERGENCY DEPARTMENT Provider Note   CSN: [id number] Arrival date & time: 04/29/17  1804     History   Chief Complaint CC: Burning with urination  HPI   Blood pressure (!) 145/91, pulse 60, temperature 98 F (36.7 C), temperature source Oral, resp. rate 16, SpO2 99 %.  Jonathan Mckay is a 22 y.o. male complaining of pain with urination over the course of the last 3 days.  No rash, testicular pain or swelling, lower abdominal pain, urethral discharge.  States he would also like to be tested for STDs.  No prior history of UTIs.  Past Medical History:  Diagnosis Date  . Eosinophilic esophagitis   . GERD (gastroesophageal reflux disease)   . History of Clostridium difficile    2012  . Nasal congestion   . Non-productive cough   . Right knee meniscal tear   . Sinusitis, acute     Patient Active Problem List   Diagnosis Date Noted  . S/P right knee arthroscopy 05/26/2013  . Eosinophilic esophagitis 09/08/2012  . Difficulty swallowing 09/05/2012  . Abdominal pain   . C. difficile diarrhea   . Blood in stool     Past Surgical History:  Procedure Laterality Date  . CLEFT PALATE REPAIR  age 22 mon old  &  47 mon old   and cleft lip repair  . DIRECT LARYNGOSCOPY  08-21-2006   W/  ESOPHAGOSCOPY AND BRONCHOSCOPY AND REMOVAL FORGEIN BODY  . ESOPHAGOGASTRODUODENOSCOPY N/A 06/20/2014   Procedure: ESOPHAGOGASTRODUODENOSCOPY (EGD);  Surgeon: Malissa Hippo, MD;  [facility];  Service: Endoscopy;  Laterality: N/A;  200  . ESOPHAGOSCOPY N/A 09/05/2012   Procedure: ESOPHAGOSCOPY Foreign Body Removal;  Surgeon: Jon Gills, MD;  Location: [facility] OR;  Service: Gastroenterology;  Laterality: N/A;  . KNEE ARTHROSCOPY Right 05/26/2013   Procedure: ARTHROSCOPY RIGHT KNEE WITH DEBRIDEMENT  PARTIAL MENISECTOMY REMOVAL OF FOREIGN BODY ;  Surgeon: Eugenia Mcalpine, MD;  Location: [facility];  Service: Orthopedics;  Laterality: Right;         Home Medications    Prior to Admission medications   Medication Sig Start Date End Date Taking? Authorizing Provider  terbinafine (LAMISIL) 250 MG tablet Take 1 tablet (250 mg total) by mouth daily. 08/20/16   Hyatt, Max T, DPM    Family History Family History  Problem Relation Age of Onset  . Stroke Other   . Asthma Other   . Cancer Other   . Inflammatory bowel disease Neg Hx     Social History Social History   Tobacco Use  . Smoking status: Never Smoker  . Smokeless tobacco: Never Used  Substance Use Topics  . Alcohol use: No  . Drug use: No     Allergies   Amoxicillin; Penicillins; Shellfish allergy; Clindamycin/lincomycin; Vancomycin; and Benadryl [diphenhydramine hcl]   Review of Systems Review of Systems  A complete review of systems was obtained and all systems are negative except as noted in the HPI and PMH.   Physical Exam Updated Vital Signs BP (!) 145/91 (BP Location: Right Arm)   Pulse 60   Temp 98 F (36.7 C) (Oral)   Resp 16   SpO2 99%   Physical Exam  Constitutional: He is oriented to person, place, and time. He appears well-developed and well-nourished. No distress.  HENT:  Head: Normocephalic and atraumatic.  Mouth/Throat: Oropharynx is clear and moist.  Eyes: Pupils are equal, round, and reactive to light. Conjunctivae and EOM  are normal.  Neck: Normal range of motion.  Cardiovascular: Normal rate, regular rhythm and intact distal pulses.  Pulmonary/Chest: Effort normal and breath sounds normal. No stridor. No respiratory distress. He has no wheezes. He has no rales. He exhibits no tenderness.  Abdominal: Soft. He exhibits no distension. There is no tenderness.  Genitourinary: Penis normal.  Genitourinary Comments: GU exam is chaperoned by nurse: No rashes or lesions, patient is circumcised, no gross urethral discharge.  Musculoskeletal: Normal range of motion.  Neurological: He is alert and oriented to person, place, and time.   Skin: Capillary refill takes less than 2 seconds. He is not diaphoretic.  Psychiatric: He has a normal mood and affect.  Nursing note and vitals reviewed.    ED Treatments / Results  Labs (all labs ordered are listed, but only abnormal results are displayed) Labs Reviewed  RPR  HIV ANTIBODY (ROUTINE TESTING)  GC/CHLAMYDIA PROBE AMP ([place]) NOT AT [facility]    EKG None  Radiology No results found.  Procedures Procedures (including critical care time)  Medications Ordered in ED Medications  cefTRIAXone (ROCEPHIN) injection 250 mg (has no administration in time range)  azithromycin (ZITHROMAX) tablet 1,000 mg (has no administration in time range)     Initial Impression / Assessment and Plan / ED Course  I have reviewed the triage vital signs and the nursing notes.  Pertinent labs & imaging results that were available during my care of the patient were reviewed by me and considered in my medical decision making (see chart for details).     Vitals:   04/29/17 1836  BP: (!) 145/91  Pulse: 60  Resp: 16  Temp: 98 F (36.7 C)  TempSrc: Oral  SpO2: 99%    Medications  cefTRIAXone (ROCEPHIN) injection 250 mg (has no administration in time range)  azithromycin (ZITHROMAX) tablet 1,000 mg (has no administration in time range)    Jonathan Mckay is 22 y.o. male presenting with burning with urination.  No prior history of UTI.  Patient is concerned about STD, will treat prophylactically, testing pending.  She has had a anaphylactic reaction to penicillins however ED pharmacist states that he has had Rocephin in the past, will give typical treatment based on the history of no anaphylactic reaction to Rocephin.  Evaluation does not show pathology that would require ongoing emergent intervention or inpatient treatment. Pt is hemodynamically stable and mentating appropriately. Discussed findings and plan with patient/guardian, who agrees with care plan. All questions answered.  Return precautions discussed and outpatient follow up given.      Final Clinical Impressions(s) / ED Diagnoses   Final diagnoses:  Urethritis    ED Discharge Orders    None       Lynetta Mareisciotta, Mardella Laymanicole, PA-C 04/29/17 1958    Mackuen, Cindee Saltourteney Lyn, MD 04/30/17 2002

## 2017-04-29 NOTE — ED Triage Notes (Addendum)
Pt reports burning with urination x several day. PT reports dark urine, no foul odor. PT requesting std testing as well

## 2017-04-30 LAB — GC/CHLAMYDIA PROBE AMP (~~LOC~~) NOT AT ARMC
Chlamydia: NEGATIVE
Neisseria Gonorrhea: NEGATIVE

## 2017-04-30 LAB — HIV ANTIBODY (ROUTINE TESTING W REFLEX): HIV SCREEN 4TH GENERATION: NONREACTIVE

## 2017-04-30 LAB — RPR: RPR Ser Ql: NONREACTIVE

## 2017-11-01 ENCOUNTER — Ambulatory Visit (HOSPITAL_COMMUNITY)
Admission: EM | Admit: 2017-11-01 | Discharge: 2017-11-01 | Disposition: A | Discharge status: Home / Self Care | Payer: 59 | Attending: Internal Medicine | Admitting: Internal Medicine

## 2017-11-01 ENCOUNTER — Encounter (HOSPITAL_COMMUNITY): Payer: Self-pay | Admitting: Emergency Medicine

## 2017-11-01 DIAGNOSIS — J320 Chronic maxillary sinusitis: Secondary | ICD-10-CM

## 2017-11-01 DIAGNOSIS — H7292 Unspecified perforation of tympanic membrane, left ear: Secondary | ICD-10-CM | POA: Diagnosis not present

## 2017-11-01 DIAGNOSIS — H60313 Diffuse otitis externa, bilateral: Secondary | ICD-10-CM

## 2017-11-01 DIAGNOSIS — H6690 Otitis media, unspecified, unspecified ear: Secondary | ICD-10-CM

## 2017-11-01 MED ORDER — NEOMYCIN-POLYMYXIN-HC 3.5-10000-1 OT SUSP: 4.0000 [drp] | Freq: Three times a day (TID) | OTIC | 0 refills | Status: AC

## 2017-11-01 MED ORDER — SULFAMETHOXAZOLE-TRIMETHOPRIM 800-160 MG PO TABS: 1.0000 | ORAL_TABLET | Freq: Two times a day (BID) | ORAL | 0 refills | Status: AC

## 2017-11-01 NOTE — ED Triage Notes (Signed)
PT reports bilateral otalgia, sore throat, and some facial pain and tightness for 2 days

## 2017-11-01 NOTE — ED Provider Notes (Signed)
[facility]    CSN: [id number] Arrival date & time: 11/01/17  1922     History   Chief Complaint Chief Complaint  Patient presents with  . Otalgia  . Facial Pain    HPI BHAVIN MONJARAZ is a 22 y.o. male.   1- Onset of bilateral ear pain 2 days ago after using a Q tip. Did not see blood. 2- has been having purulent nose congestion x 2 days. Due to cleft palate he is prone to get sinusitis. Has not had fever or chills, but his lower back gets achy when he gets infections and this pm felt this way. States he is normally placed on Bactrim due to multiple allergies and this helps.      Past Medical History:  Diagnosis Date  . Eosinophilic esophagitis   . GERD (gastroesophageal reflux disease)   . History of Clostridium difficile    2012  . Nasal congestion   . Non-productive cough   . Right knee meniscal tear   . Sinusitis, acute     Patient Active Problem List   Diagnosis Date Noted  . S/P right knee arthroscopy 05/26/2013  . Eosinophilic esophagitis 09/08/2012  . Difficulty swallowing 09/05/2012  . Abdominal pain   . C. difficile diarrhea   . Blood in stool     Past Surgical History:  Procedure Laterality Date  . CLEFT PALATE REPAIR  age 11 mon old  &  56 mon old   and cleft lip repair  . DIRECT LARYNGOSCOPY  08-21-2006   W/  ESOPHAGOSCOPY AND BRONCHOSCOPY AND REMOVAL FORGEIN BODY  . ESOPHAGOGASTRODUODENOSCOPY N/A 06/20/2014   Procedure: ESOPHAGOGASTRODUODENOSCOPY (EGD);  Surgeon: Malissa Hippo, MD;  [facility];  Service: Endoscopy;  Laterality: N/A;  200  . ESOPHAGOSCOPY N/A 09/05/2012   Procedure: ESOPHAGOSCOPY Foreign Body Removal;  Surgeon: Jon Gills, MD;  Location: [facility] OR;  Service: Gastroenterology;  Laterality: N/A;  . KNEE ARTHROSCOPY Right 05/26/2013   Procedure: ARTHROSCOPY RIGHT KNEE WITH DEBRIDEMENT  PARTIAL MENISECTOMY REMOVAL OF FOREIGN BODY ;  Surgeon: Eugenia Mcalpine, MD;  Location: [facility];  Service:  Orthopedics;  Laterality: Right;       Home Medications    Prior to Admission medications   Medication Sig Start Date End Date Taking? Authorizing Provider  esomeprazole (NEXIUM) 20 MG capsule Take 20 mg by mouth daily at 12 noon.   Yes [provider]  neomycin-polymyxin-hydrocortisone (CORTISPORIN) 3.5-10000-1 OTIC suspension Place 4 drops into both ears 3 (three) times daily for 7 days. 11/01/17 11/08/17  Rodriguez-Southworth, Nettie Elm, PA-C  sulfamethoxazole-trimethoprim (BACTRIM DS,SEPTRA DS) 800-160 MG tablet Take 1 tablet by mouth 2 (two) times daily for 10 days. 11/01/17 11/11/17  Rodriguez-Southworth, Nettie Elm, PA-C    Family History Family History  Problem Relation Age of Onset  . Stroke Other   . Asthma Other   . Cancer Other   . Inflammatory bowel disease Neg Hx     Social History Social History   Tobacco Use  . Smoking status: Never Smoker  . Smokeless tobacco: Never Used  Substance Use Topics  . Alcohol use: No  . Drug use: No     Allergies   Amoxicillin; Penicillins; Shellfish allergy; Clindamycin/lincomycin; Vancomycin; and Benadryl [diphenhydramine hcl]   Review of Systems Review of Systems  Constitutional: Positive for fatigue. Negative for chills, diaphoresis and fever.  HENT: Positive for ear pain, postnasal drip, sinus pressure, sinus pain and sore throat. Negative for congestion, ear discharge, facial  swelling and trouble swallowing.        Face is painful  Eyes: Negative for discharge.  Respiratory: Negative for cough.   Cardiovascular: Negative for chest pain.  Musculoskeletal: Positive for back pain.  Skin: Negative for rash.  Hematological: Negative for adenopathy.     Physical Exam Triage Vital Signs ED Triage Vitals [11/01/17 2000]  Enc Vitals Group     BP 126/78     Pulse Rate 65     Resp 16     Temp 97.8 F (36.6 C)     Temp Source Oral     SpO2 98 %     Weight 150 lb (68 kg)     Height      Head Circumference       Peak Flow      Pain Score 5     Pain Loc      Pain Edu?      Excl. in GC?    No data found.  Updated Vital Signs BP 126/78   Pulse 65   Temp 97.8 F (36.6 C) (Oral)   Resp 16   Wt 150 lb (68 kg)   SpO2 98%   BMI 20.92 kg/m   Visual Acuity Right Eye Distance:   Left Eye Distance:   Bilateral Distance:    Right Eye Near:   Left Eye Near:    Bilateral Near:     Physical Exam  Constitutional: He is oriented to person, place, and time. He appears well-developed and well-nourished.  HENT:  Head: Normocephalic.  Both canals and TMs red, no perforations noted. Nose with lime yellow mucous bilaterally  Eyes: Conjunctivae are normal.  Neck: Normal range of motion. Neck supple.  Cardiovascular: Normal rate and regular rhythm.  No murmur heard. Pulmonary/Chest: Effort normal and breath sounds normal. He has no wheezes.  Musculoskeletal: Normal range of motion.  Neurological: He is alert and oriented to person, place, and time.  Skin: Skin is warm and dry. No rash noted.  Psychiatric: He has a normal mood and affect. His behavior is normal. Judgment and thought content normal.     UC Treatments / Results  Labs (all labs ordered are listed, but only abnormal results are displayed) Labs Reviewed - No data to display  EKG None  Radiology No results found.  Procedures Procedures (including critical care time)  Medications Ordered in UC Medications - No data to display  Initial Impression / Assessment and Plan / UC Course  I have reviewed the triage vital signs and the nursing notes. I placed him on cortisporin otic gtts and oral Bactrim DS as noted. FU prn if not improving in 72h,      Final Clinical Impressions(s) / UC Diagnoses   Final diagnoses:  Acute diffuse otitis externa of both ears  Acute otitis media, unspecified otitis media type  Chronic maxillary sinusitis   Discharge Instructions   None    ED Prescriptions    Medication Sig Dispense Auth.  Provider   neomycin-polymyxin-hydrocortisone (CORTISPORIN) 3.5-10000-1 OTIC suspension Place 4 drops into both ears 3 (three) times daily for 7 days. 15 mL Rodriguez-Southworth, Anasha Perfecto, PA-C   sulfamethoxazole-trimethoprim (BACTRIM DS,SEPTRA DS) 800-160 MG tablet Take 1 tablet by mouth 2 (two) times daily for 10 days. 20 tablet Rodriguez-Southworth, Nettie Elm, PA-C     Controlled Substance Prescriptions [place] Controlled Substance Registry consulted? no   Garey Ham, [place] 11/01/17 2044

## 2017-11-02 ENCOUNTER — Encounter (HOSPITAL_COMMUNITY): Payer: Self-pay

## 2017-11-02 ENCOUNTER — Other Ambulatory Visit: Payer: Self-pay

## 2017-11-02 ENCOUNTER — Emergency Department (HOSPITAL_COMMUNITY)
Admission: EM | Admit: 2017-11-02 | Discharge: 2017-11-02 | Disposition: A | Discharge status: Home / Self Care | Payer: 59 | Attending: Emergency Medicine | Admitting: Emergency Medicine

## 2017-11-02 DIAGNOSIS — H7292 Unspecified perforation of tympanic membrane, left ear: Secondary | ICD-10-CM

## 2017-11-02 DIAGNOSIS — Z79899 Other long term (current) drug therapy: Secondary | ICD-10-CM | POA: Diagnosis not present

## 2017-11-02 DIAGNOSIS — H9203 Otalgia, bilateral: Secondary | ICD-10-CM | POA: Diagnosis not present

## 2017-11-02 MED ORDER — HYDROCODONE-ACETAMINOPHEN 5-325 MG PO TABS
1.0000 | ORAL_TABLET | ORAL | 0 refills | Status: DC | PRN
Start: 2017-11-02 — End: 2020-07-14

## 2017-11-02 MED ORDER — OFLOXACIN 0.3 % OP SOLN: 10.0000 [drp] | Freq: Every day | OPHTHALMIC | 0 refills | Status: DC

## 2017-11-02 MED ORDER — OFLOXACIN 0.3 % OT SOLN: 10.0000 [drp] | Freq: Every day | OTIC | 0 refills | Status: DC

## 2017-11-02 MED ORDER — IBUPROFEN 800 MG PO TABS: 800.0000 mg | ORAL_TABLET | Freq: Four times a day (QID) | ORAL | 0 refills | Status: DC | PRN

## 2017-11-02 MED ORDER — OFLOXACIN 0.3 % OP SOLN
5.0000 [drp] | Freq: Every day | OPHTHALMIC | Status: DC
  Filled 2017-11-02: qty 5

## 2017-11-02 MED ORDER — OFLOXACIN 0.3 % OT SOLN: 10.0000 [drp] | Freq: Every day | OTIC | 0 refills | Status: AC

## 2017-11-02 MED ORDER — OFLOXACIN 0.3 % OP SOLN: 10.0000 [drp] | Freq: Every day | OPHTHALMIC | 0 refills | Status: AC

## 2017-11-02 NOTE — ED Provider Notes (Signed)
[facility] EMERGENCY DEPARTMENT Provider Note   CSN: [id number] Arrival date & time: 11/02/17  0112     History   Chief Complaint Chief Complaint  Patient presents with  . Otalgia    HPI Jonathan Mckay is a 22 y.o. male.  Patient presents to the emergency department for evaluation of ear pain.  Patient reports that he was seen in urgent care today for bilateral ear pain and sinus congestion.  He reports that he had his ears cleaned out and during the procedure he had onset of worsening, severe left ear pain.  Reports decreased hearing.     Past Medical History:  Diagnosis Date  . Eosinophilic esophagitis   . GERD (gastroesophageal reflux disease)   . History of Clostridium difficile    2012  . Nasal congestion   . Non-productive cough   . Right knee meniscal tear   . Sinusitis, acute     Patient Active Problem List   Diagnosis Date Noted  . S/P right knee arthroscopy 05/26/2013  . Eosinophilic esophagitis 09/08/2012  . Difficulty swallowing 09/05/2012  . Abdominal pain   . C. difficile diarrhea   . Blood in stool     Past Surgical History:  Procedure Laterality Date  . CLEFT PALATE REPAIR  age 51 mon old  &  47 mon old   and cleft lip repair  . DIRECT LARYNGOSCOPY  08-21-2006   W/  ESOPHAGOSCOPY AND BRONCHOSCOPY AND REMOVAL FORGEIN BODY  . ESOPHAGOGASTRODUODENOSCOPY N/A 06/20/2014   Procedure: ESOPHAGOGASTRODUODENOSCOPY (EGD);  Surgeon: Malissa Hippo, MD;  [facility];  Service: Endoscopy;  Laterality: N/A;  200  . ESOPHAGOSCOPY N/A 09/05/2012   Procedure: ESOPHAGOSCOPY Foreign Body Removal;  Surgeon: Jon Gills, MD;  Location: [facility] OR;  Service: Gastroenterology;  Laterality: N/A;  . KNEE ARTHROSCOPY Right 05/26/2013   Procedure: ARTHROSCOPY RIGHT KNEE WITH DEBRIDEMENT  PARTIAL MENISECTOMY REMOVAL OF FOREIGN BODY ;  Surgeon: Eugenia Mcalpine, MD;  Location: [facility];  Service: Orthopedics;  Laterality: Right;        Home  Medications    Prior to Admission medications   Medication Sig Start Date End Date Taking? Authorizing Provider  esomeprazole (NEXIUM) 20 MG capsule Take 20 mg by mouth daily at 12 noon.    [provider]  HYDROcodone-acetaminophen (NORCO/VICODIN) 5-325 MG tablet Take 1-2 tablets by mouth every 4 (four) hours as needed. 11/02/17   Gilda Crease, MD  ibuprofen (ADVIL,MOTRIN) 800 MG tablet Take 1 tablet (800 mg total) by mouth every 6 (six) hours as needed. 11/02/17   Pollina, Canary Brim, MD  neomycin-polymyxin-hydrocortisone (CORTISPORIN) 3.5-10000-1 OTIC suspension Place 4 drops into both ears 3 (three) times daily for 7 days. 11/01/17 11/08/17  Rodriguez-Southworth, Nettie Elm, PA-C  sulfamethoxazole-trimethoprim (BACTRIM DS,SEPTRA DS) 800-160 MG tablet Take 1 tablet by mouth 2 (two) times daily for 10 days. 11/01/17 11/11/17  Rodriguez-Southworth, Nettie Elm, PA-C    Family History Family History  Problem Relation Age of Onset  . Stroke Other   . Asthma Other   . Cancer Other   . Inflammatory bowel disease Neg Hx     Social History Social History   Tobacco Use  . Smoking status: Never Smoker  . Smokeless tobacco: Never Used  Substance Use Topics  . Alcohol use: No  . Drug use: No     Allergies   Amoxicillin; Penicillins; Shellfish allergy; Clindamycin/lincomycin; Vancomycin; and Benadryl [diphenhydramine hcl]   Review of Systems Review of Systems  HENT:  Positive for congestion and ear pain.   All other systems reviewed and are negative.    Physical Exam Updated Vital Signs BP (!) 136/106 (BP Location: Right Arm)   Pulse 62   Temp 98.7 F (37.1 C) (Oral)   Resp 18   SpO2 99%   Physical Exam  Constitutional: He is oriented to person, place, and time. He appears well-developed and well-nourished. No distress.  HENT:  Head: Normocephalic and atraumatic.  Right Ear: Hearing normal.  Left Ear: Hearing normal. There is swelling. Tympanic membrane is  perforated and erythematous.  Nose: Nose normal.  Mouth/Throat: Oropharynx is clear and moist and mucous membranes are normal.  Eyes: Pupils are equal, round, and reactive to light. Conjunctivae and EOM are normal.  Neck: Normal range of motion. Neck supple.  Cardiovascular: Regular rhythm, S1 normal and S2 normal. Exam reveals no gallop and no friction rub.  No murmur heard. Pulmonary/Chest: Effort normal and breath sounds normal. No respiratory distress. He exhibits no tenderness.  Abdominal: Soft. Normal appearance and bowel sounds are normal. There is no hepatosplenomegaly. There is no tenderness. There is no rebound, no guarding, no tenderness at McBurney's point and negative Murphy's sign. No hernia.  Musculoskeletal: Normal range of motion.  Neurological: He is alert and oriented to person, place, and time. He has normal strength. No cranial nerve deficit or sensory deficit. Coordination normal. GCS eye subscore is 4. GCS verbal subscore is 5. GCS motor subscore is 6.  Skin: Skin is warm, dry and intact. No rash noted. No cyanosis.  Psychiatric: He has a normal mood and affect. His speech is normal and behavior is normal. Thought content normal.  Nursing note and vitals reviewed.    ED Treatments / Results  Labs (all labs ordered are listed, but only abnormal results are displayed) Labs Reviewed - No data to display  EKG None  Radiology No results found.  Procedures Procedures (including critical care time)  Medications Ordered in ED Medications  ofloxacin (OCUFLOX) 0.3 % ophthalmic solution 5 drop (has no administration in time range)     Initial Impression / Assessment and Plan / ED Course  I have reviewed the triage vital signs and the nursing notes.  Pertinent labs & imaging results that were available during my care of the patient were reviewed by me and considered in my medical decision making (see chart for details).     Emanation reveals evidence of left  hepatic membrane rupture.  He was prescribed Bactrim and Cortisporin Otic at urgent care.  Will discontinue Cortisporin Otic, substitute ofloxacin.  Follow-up with ENT.  Given tympanic membrane rupture precautions.  Final Clinical Impressions(s) / ED Diagnoses   Final diagnoses:  Perforation of left tympanic membrane    ED Discharge Orders         Ordered    HYDROcodone-acetaminophen (NORCO/VICODIN) 5-325 MG tablet  Every 4 hours PRN     11/02/17 0135    ibuprofen (ADVIL,MOTRIN) 800 MG tablet  Every 6 hours PRN     11/02/17 0136           Gilda Crease, MD 11/02/17 [phone]

## 2017-11-02 NOTE — ED Triage Notes (Signed)
Ear pain since yesterday

## 2017-11-03 MED FILL — Hydrocodone-Acetaminophen Tab 5-325 MG: ORAL | Qty: 6 | Status: AC

## 2018-02-04 DIAGNOSIS — J0100 Acute maxillary sinusitis, unspecified: Secondary | ICD-10-CM | POA: Diagnosis not present

## 2018-02-28 DIAGNOSIS — J111 Influenza due to unidentified influenza virus with other respiratory manifestations: Secondary | ICD-10-CM | POA: Diagnosis not present

## 2018-02-28 DIAGNOSIS — J029 Acute pharyngitis, unspecified: Secondary | ICD-10-CM | POA: Diagnosis not present

## 2018-03-03 ENCOUNTER — Telehealth (HOSPITAL_COMMUNITY): Payer: Self-pay | Admitting: Emergency Medicine

## 2018-03-03 ENCOUNTER — Encounter (HOSPITAL_COMMUNITY): Payer: Self-pay | Admitting: Emergency Medicine

## 2018-03-03 ENCOUNTER — Other Ambulatory Visit: Payer: Self-pay

## 2018-03-03 ENCOUNTER — Ambulatory Visit (HOSPITAL_COMMUNITY)
Admission: EM | Admit: 2018-03-03 | Discharge: 2018-03-03 | Disposition: A | Discharge status: Home / Self Care | Payer: 59 | Attending: Family Medicine | Admitting: Family Medicine

## 2018-03-03 DIAGNOSIS — L0291 Cutaneous abscess, unspecified: Secondary | ICD-10-CM

## 2018-03-03 MED ORDER — DOXYCYCLINE HYCLATE 100 MG PO TABS: 100.0000 mg | ORAL_TABLET | Freq: Two times a day (BID) | ORAL | 0 refills | Status: DC

## 2018-03-03 NOTE — Discharge Instructions (Addendum)
Warm moist compresses every couple hours.

## 2018-03-03 NOTE — Telephone Encounter (Signed)
Pharmacy change

## 2018-03-03 NOTE — ED Provider Notes (Signed)
[facility]    CSN: [id number] Arrival date & time: 03/03/18  1312     History   Chief Complaint Chief Complaint  Patient presents with  . Abscess    HPI Jonathan Mckay is a 23 y.o. male.   Patient has abscess to right thigh.  History of the same.  Patient noticed this area today.  Established patient. Patient taking tamiflu this week.       Past Medical History:  Diagnosis Date  . Eosinophilic esophagitis   . GERD (gastroesophageal reflux disease)   . History of Clostridium difficile    2012  . Nasal congestion   . Non-productive cough   . Right knee meniscal tear   . Sinusitis, acute     Patient Active Problem List   Diagnosis Date Noted  . S/P right knee arthroscopy 05/26/2013  . Eosinophilic esophagitis 09/08/2012  . Difficulty swallowing 09/05/2012  . Abdominal pain   . C. difficile diarrhea   . Blood in stool     Past Surgical History:  Procedure Laterality Date  . CLEFT PALATE REPAIR  age 48 mon old  &  1 mon old   and cleft lip repair  . DIRECT LARYNGOSCOPY  08-21-2006   W/  ESOPHAGOSCOPY AND BRONCHOSCOPY AND REMOVAL FORGEIN BODY  . ESOPHAGOGASTRODUODENOSCOPY N/A 06/20/2014   Procedure: ESOPHAGOGASTRODUODENOSCOPY (EGD);  Surgeon: Malissa Hippo, MD;  [facility];  Service: Endoscopy;  Laterality: N/A;  200  . ESOPHAGOSCOPY N/A 09/05/2012   Procedure: ESOPHAGOSCOPY Foreign Body Removal;  Surgeon: Jon Gills, MD;  Location: [facility];  Service: Gastroenterology;  Laterality: N/A;  . KNEE ARTHROSCOPY Right 05/26/2013   Procedure: ARTHROSCOPY RIGHT KNEE WITH DEBRIDEMENT  PARTIAL MENISECTOMY REMOVAL OF FOREIGN BODY ;  Surgeon: Eugenia Mcalpine, MD;  Location: [facility];  Service: Orthopedics;  Laterality: Right;       Home Medications    Prior to Admission medications   Medication Sig Start Date End Date Taking? Authorizing Provider  acetaminophen (TYLENOL) 325 MG tablet Take 650 mg by mouth every 6 (six) hours as  needed.   Yes [provider]  oseltamivir (TAMIFLU) 75 MG capsule Take 75 mg by mouth.   Yes [provider]  doxycycline (VIBRA-TABS) 100 MG tablet Take 1 tablet (100 mg total) by mouth 2 (two) times daily. 03/03/18   Elvina Sidle, MD  esomeprazole (NEXIUM) 20 MG capsule Take 20 mg by mouth daily at 12 noon.    [provider]  HYDROcodone-acetaminophen (NORCO/VICODIN) 5-325 MG tablet Take 1-2 tablets by mouth every 4 (four) hours as needed. 11/02/17   Gilda Crease, MD  ibuprofen (ADVIL,MOTRIN) 800 MG tablet Take 1 tablet (800 mg total) by mouth every 6 (six) hours as needed. 11/02/17   Gilda Crease, MD    Family History Family History  Problem Relation Age of Onset  . Stroke Other   . Asthma Other   . Cancer Other   . Inflammatory bowel disease Neg Hx     Social History Social History   Tobacco Use  . Smoking status: Never Smoker  . Smokeless tobacco: Never Used  Substance Use Topics  . Alcohol use: No  . Drug use: No     Allergies   Amoxicillin; Penicillins; Shellfish allergy; Clindamycin/lincomycin; Vancomycin; and Benadryl [diphenhydramine hcl]   Review of Systems Review of Systems  Skin: Positive for wound.     Physical Exam Triage Vital Signs ED Triage Vitals  Enc Vitals  Group     BP 03/03/18 1406 123/72     Pulse Rate 03/03/18 1406 68     Resp 03/03/18 1406 16     Temp 03/03/18 1406 98.2 F (36.8 C)     Temp Source 03/03/18 1406 Temporal     SpO2 03/03/18 1406 98 %     Weight --      Height --      Head Circumference --      Peak Flow --      Pain Score 03/03/18 1402 3     Pain Loc --      Pain Edu? --      Excl. in GC? --    No data found.  Updated Vital Signs BP 123/72 (BP Location: Right Arm)   Pulse 68   Temp 98.2 F (36.8 C) (Temporal)   Resp 16   SpO2 98%    Physical Exam Vitals signs and nursing note reviewed.  Constitutional:      Appearance: Normal appearance. He is normal  weight.  Eyes:     Conjunctiva/sclera: Conjunctivae normal.  Neck:     Musculoskeletal: Normal range of motion and neck supple.  Pulmonary:     Effort: Pulmonary effort is normal.  Musculoskeletal: Normal range of motion.  Skin:    Findings: Abscess present.          Comments: 0.5 cm round abscess with fluctuance, appears to have previously opened  Neurological:     General: No focal deficit present.     Mental Status: He is alert.  Psychiatric:        Mood and Affect: Mood normal.      UC Treatments / Results  Labs (all labs ordered are listed, but only abnormal results are displayed) Labs Reviewed - No data to display  EKG None  Radiology No results found.  Procedures Procedures (including critical care time)  Medications Ordered in UC Medications - No data to display  Initial Impression / Assessment and Plan / UC Course  I have reviewed the triage vital signs and the nursing notes.  Pertinent labs & imaging results that were available during my care of the patient were reviewed by me and considered in my medical decision making (see chart for details).     Final Clinical Impressions(s) / UC Diagnoses   Final diagnoses:  Abscess     Discharge Instructions     Warm moist compresses every couple hours.    ED Prescriptions    Medication Sig Dispense Auth. Provider   doxycycline (VIBRA-TABS) 100 MG tablet Take 1 tablet (100 mg total) by mouth 2 (two) times daily. 20 tablet Elvina Sidle, MD     Controlled Substance Prescriptions [place] Controlled Substance Registry consulted? Not Applicable   Elvina Sidle, MD 03/03/18 1432

## 2018-03-03 NOTE — ED Triage Notes (Signed)
Patient has abscess to right thigh.  History of the same.  Patient noticed this area today Patient taking tamiflu this week.

## 2018-04-24 ENCOUNTER — Telehealth: Payer: 59 | Admitting: Physician Assistant

## 2018-04-24 ENCOUNTER — Encounter: Payer: Self-pay | Admitting: Physician Assistant

## 2018-04-24 DIAGNOSIS — L237 Allergic contact dermatitis due to plants, except food: Secondary | ICD-10-CM

## 2018-04-24 MED ORDER — PREDNISONE 5 MG PO TABS: 5.0000 mg | ORAL_TABLET | ORAL | 0 refills | Status: DC

## 2018-04-24 NOTE — Progress Notes (Signed)
E Visit for Rash  We are sorry that you are not feeling well. Here is how we plan to help!   I recommend you take Benadryl 25 mg - 50 mg every 4 hours to control the symptoms but if they last over 24 hours it is best that you see an office based provider for follow up.  Based of what you shared with me you have dermatitis due to exposure to poison oak. I have prescribed steroids to help with this.   Prednisone 5 mg daily for 6 days (see taper instructions below)  Directions for 6 day taper: Day 1: 2 tablets before breakfast, 1 after both lunch & dinner and 2 at bedtime Day 2: 1 tab before breakfast, 1 after both lunch & dinner and 2 at bedtime Day 3: 1 tab at each meal & 1 at bedtime Day 4: 1 tab at breakfast, 1 at lunch, 1 at bedtime Day 5: 1 tab at breakfast & 1 tab at bedtime Day 6: 1 tab at breakfast      HOME CARE:   Take cool showers and avoid direct sunlight.  Apply cool compress or wet dressings.  Take a bath in an oatmeal bath.  Sprinkle content of one Aveeno packet under running faucet with comfortably warm water.  Bathe for 15-20 minutes, 1-2 times daily.  Pat dry with a towel. Do not rub the rash.  Use hydrocortisone cream.  Take an antihistamine like Benadryl for widespread rashes that itch.  The adult dose of Benadryl is 25-50 mg by mouth 4 times daily.  Caution:  This type of medication may cause sleepiness.  Do not drink alcohol, drive, or operate dangerous machinery while taking antihistamines.  Do not take these medications if you have prostate enlargement.  Read package instructions thoroughly on all medications that you take.  GET HELP RIGHT AWAY IF:   Symptoms don't go away after treatment.  Severe itching that persists.  If you rash spreads or swells.  If you rash begins to smell.  If it blisters and opens or develops a yellow-brown crust.  You develop a fever.  You have a sore throat.  You become short of breath.  MAKE SURE  YOU:  Understand these instructions. Will watch your condition. Will get help right away if you are not doing well or get worse.  Thank you for choosing an e-visit. Your e-visit answers were reviewed by a board certified advanced clinical practitioner to complete your personal care plan. Depending upon the condition, your plan could have included both over the counter or prescription medications. Please review your pharmacy choice. Be sure that the pharmacy you have chosen is open so that you can pick up your prescription now.  If there is a problem you may message your provider in MyChart to have the prescription routed to another pharmacy. Your safety is important to [place]. If you have drug allergies check your prescription carefully.  For the next 24 hours, you can use MyChart to ask questions about today's visit, request a non-urgent call back, or ask for a work or school excuse from your e-visit provider. You will get an email in the next two days asking about your experience. I hope that your e-visit has been valuable and will speed your recovery.     I have spent 7 min in completion and review of this note- [facility]

## 2019-05-04 ENCOUNTER — Emergency Department (HOSPITAL_COMMUNITY): Payer: 59

## 2019-05-04 ENCOUNTER — Other Ambulatory Visit: Payer: Self-pay

## 2019-05-04 ENCOUNTER — Emergency Department (HOSPITAL_COMMUNITY)
Admission: EM | Admit: 2019-05-04 | Discharge: 2019-05-04 | Disposition: A | Discharge status: Home / Self Care | Payer: 59 | Attending: Emergency Medicine | Admitting: Emergency Medicine

## 2019-05-04 ENCOUNTER — Encounter (HOSPITAL_COMMUNITY): Payer: Self-pay | Admitting: Emergency Medicine

## 2019-05-04 DIAGNOSIS — J029 Acute pharyngitis, unspecified: Secondary | ICD-10-CM | POA: Insufficient documentation

## 2019-05-04 DIAGNOSIS — Z79899 Other long term (current) drug therapy: Secondary | ICD-10-CM | POA: Insufficient documentation

## 2019-05-04 DIAGNOSIS — Z20822 Contact with and (suspected) exposure to covid-19: Secondary | ICD-10-CM | POA: Insufficient documentation

## 2019-05-04 DIAGNOSIS — J028 Acute pharyngitis due to other specified organisms: Secondary | ICD-10-CM

## 2019-05-04 DIAGNOSIS — R079 Chest pain, unspecified: Secondary | ICD-10-CM | POA: Diagnosis not present

## 2019-05-04 MED ORDER — NAPROXEN 500 MG PO TABS: 500.0000 mg | ORAL_TABLET | Freq: Two times a day (BID) | ORAL | 0 refills | Status: DC

## 2019-05-04 MED ORDER — BENZONATATE 100 MG PO CAPS
200.0000 mg | ORAL_CAPSULE | Freq: Once | ORAL | Status: AC
  Administered 2019-05-04: 22:00:00 200 mg via ORAL
  Filled 2019-05-04: qty 2

## 2019-05-04 MED ORDER — BENZONATATE 100 MG PO CAPS: 100.0000 mg | ORAL_CAPSULE | Freq: Three times a day (TID) | ORAL | 0 refills | Status: DC

## 2019-05-04 MED ORDER — NAPROXEN 250 MG PO TABS
500.0000 mg | ORAL_TABLET | Freq: Once | ORAL | Status: AC
  Administered 2019-05-04: 500 mg via ORAL
  Filled 2019-05-04: qty 2

## 2019-05-04 NOTE — ED Provider Notes (Signed)
[facility] EMERGENCY DEPARTMENT Provider Note   CSN: [id number] Arrival date & time: 05/04/19  2015     History Chief Complaint  Patient presents with  . Sore Throat    body aches    Jonathan Mckay is a 24 y.o. male.  Patient is a 24 year old male with history of cleft palate presenting to the emergency department for cough, sore throat and chest discomfort which started about 3 or 4 days ago.  Patient reports that he had some pain which started in his right side of his back and progressed to his chest which feels like a burning and sharp sensation.  Reports associated cough and sore throat.  Reports that he is concerned he possibly could have Covid since he reports his mother had Covid a few months ago and had similar symptoms.  He is eating normally and drinking adequate fluids.  Denies fever, chills, nausea, vomiting.  He does report using a vape 2 or 3 days ago but thought this may be contributing to the chest pain so he stopped.        Past Medical History:  Diagnosis Date  . Eosinophilic esophagitis   . GERD (gastroesophageal reflux disease)   . History of Clostridium difficile    2012  . Nasal congestion   . Non-productive cough   . Right knee meniscal tear   . Sinusitis, acute     Patient Active Problem List   Diagnosis Date Noted  . S/P right knee arthroscopy 05/26/2013  . Eosinophilic esophagitis 95/10/3265  . Difficulty swallowing 09/05/2012  . Abdominal pain   . C. difficile diarrhea   . Blood in stool     Past Surgical History:  Procedure Laterality Date  . CLEFT PALATE REPAIR  age 76 mon old  &  78 mon old   and cleft lip repair  . DIRECT LARYNGOSCOPY  08-21-2006   W/  ESOPHAGOSCOPY AND BRONCHOSCOPY AND REMOVAL FORGEIN BODY  . ESOPHAGOGASTRODUODENOSCOPY N/A 06/20/2014   Procedure: ESOPHAGOGASTRODUODENOSCOPY (EGD);  Surgeon: Rogene Houston, MD;  [facility];  Service: Endoscopy;  Laterality: N/A;  200  . ESOPHAGOSCOPY N/A 09/05/2012   Procedure: ESOPHAGOSCOPY Foreign Body Removal;  Surgeon: Oletha Blend, MD;  Location: [place];  Service: Gastroenterology;  Laterality: N/A;  . KNEE ARTHROSCOPY Right 05/26/2013   Procedure: ARTHROSCOPY RIGHT KNEE WITH DEBRIDEMENT  PARTIAL MENISECTOMY REMOVAL OF FOREIGN BODY ;  Surgeon: Sydnee Cabal, MD;  Location: [place];  Service: Orthopedics;  Laterality: Right;       Family History  Problem Relation Age of Onset  . Stroke Other   . Asthma Other   . Cancer Other   . Inflammatory bowel disease Neg Hx     Social History   Tobacco Use  . Smoking status: Never Smoker  . Smokeless tobacco: Never Used  Substance Use Topics  . Alcohol use: No  . Drug use: No    Home Medications Prior to Admission medications   Medication Sig Start Date End Date Taking? Authorizing Provider  acetaminophen (TYLENOL) 325 MG tablet Take 650 mg by mouth every 6 (six) hours as needed.    [provider]  doxycycline (VIBRA-TABS) 100 MG tablet Take 1 tablet (100 mg total) by mouth 2 (two) times daily. 03/03/18   Robyn Haber, MD  esomeprazole (NEXIUM) 20 MG capsule Take 20 mg by mouth daily at 12 noon.    [provider]  HYDROcodone-acetaminophen (NORCO/VICODIN) 5-325 MG tablet Take 1-2 tablets by  mouth every 4 (four) hours as needed. 11/02/17   Gilda Crease, MD  ibuprofen (ADVIL,MOTRIN) 800 MG tablet Take 1 tablet (800 mg total) by mouth every 6 (six) hours as needed. 11/02/17   Gilda Crease, MD  oseltamivir (TAMIFLU) 75 MG capsule Take 75 mg by mouth.    [provider]  predniSONE (DELTASONE) 5 MG tablet Take 1 tablet (5 mg total) by mouth as directed. Take 6 pills today, decrease by one pill until done 04/24/18   Demetrio Lapping, PA-C    Allergies    Amoxicillin, Penicillins, Shellfish allergy, Clindamycin/lincomycin, Vancomycin, and Benadryl [diphenhydramine hcl]  Review of Systems   Review of Systems  Constitutional: Negative for  appetite change, chills and fever.  HENT: Positive for sore throat. Negative for congestion, ear pain, rhinorrhea, tinnitus, trouble swallowing and voice change.   Eyes: Negative for pain and visual disturbance.  Respiratory: Positive for cough and chest tightness. Negative for shortness of breath.   Cardiovascular: Positive for chest pain. Negative for palpitations and leg swelling.  Gastrointestinal: Negative for abdominal pain, diarrhea, nausea and vomiting.  Genitourinary: Negative for dysuria and hematuria.  Musculoskeletal: Negative for arthralgias and back pain.  Skin: Negative for color change and rash.  Neurological: Negative for seizures and syncope.  All other systems reviewed and are negative.   Physical Exam Updated Vital Signs BP 136/84 (BP Location: Left Arm)   Pulse 72   Temp 97.7 F (36.5 C) (Oral)   Resp 18   Ht 5\' 11"  (1.803 m)   Wt 68 kg   SpO2 99%   BMI 20.92 kg/m   Physical Exam Vitals and nursing note reviewed.  Constitutional:      General: He is not in acute distress.    Appearance: Normal appearance. He is well-developed. He is not ill-appearing, toxic-appearing or diaphoretic.  HENT:     Head: Normocephalic and atraumatic.     Mouth/Throat:     Mouth: Mucous membranes are moist.     Tonsils: No tonsillar exudate.     Comments: Cleft palate present, clear post nasal drip Eyes:     Conjunctiva/sclera: Conjunctivae normal.  Cardiovascular:     Rate and Rhythm: Normal rate and regular rhythm.  Pulmonary:     Effort: Pulmonary effort is normal.     Breath sounds: Normal breath sounds. No wheezing, rhonchi or rales.  Skin:    General: Skin is dry.  Neurological:     Mental Status: He is alert.  Psychiatric:        Mood and Affect: Mood normal.     ED Results / Procedures / Treatments   Labs (all labs ordered are listed, but only abnormal results are displayed) Labs Reviewed - No data to display  EKG None  Radiology No results  found.  Procedures Procedures (including critical care time)  Medications Ordered in ED Medications - No data to display  ED Course  I have reviewed the triage vital signs and the nursing notes.  Pertinent labs & imaging results that were available during my care of the patient were reviewed by me and considered in my medical decision making (see chart for details).    MDM Rules/Calculators/A&P                      Based on review of vitals, medical screening exam, lab work and/or imaging, there does not appear to be an acute, emergent etiology for the patient's symptoms. Counseled pt on  good return precautions and encouraged both PCP and ED follow-up as needed.  Prior to discharge, I also discussed incidental imaging findings with patient in detail and advised appropriate, recommended follow-up in detail.  Clinical Impression: 1. Pharyngitis due to other organism   2. Chest pain, unspecified type   3. Suspected COVID-19 virus infection     Disposition: Discharge  Prior to providing a prescription for a controlled substance, I independently reviewed the patient's recent prescription history on the [place] Controlled Substance Reporting System. The patient had no recent or regular prescriptions and was deemed appropriate for a brief, less than 3 day prescription of narcotic for acute analgesia.  This note was prepared with assistance of Conservation officer, historic buildings. Occasional wrong-word or sound-a-like substitutions may have occurred due to the inherent limitations of voice recognition software.  Final Clinical Impression(s) / ED Diagnoses Final diagnoses:  None    Rx / DC Orders ED Discharge Orders    None       Jeral Pinch 05/13/19 2035    Milagros Loll, MD 05/17/19 1252

## 2019-05-04 NOTE — Discharge Instructions (Signed)
Your chest xray and EKG were reassuring. Your covid test is pending. Thank you for allowing me to care for you today. Please return to the emergency department if you have new or worsening symptoms.

## 2019-05-04 NOTE — ED Triage Notes (Signed)
Patient states sore throat, body aches, patient states chest burns  when he takes a breath x 3 days.

## 2019-05-05 LAB — SARS CORONAVIRUS 2 (TAT 6-24 HRS): SARS Coronavirus 2: NEGATIVE

## 2019-08-17 ENCOUNTER — Ambulatory Visit (INDEPENDENT_AMBULATORY_CARE_PROVIDER_SITE_OTHER): Payer: 59 | Admitting: Gastroenterology

## 2019-09-09 ENCOUNTER — Other Ambulatory Visit: Payer: Self-pay

## 2019-09-09 ENCOUNTER — Encounter (HOSPITAL_COMMUNITY): Payer: Self-pay | Admitting: *Deleted

## 2019-09-09 ENCOUNTER — Emergency Department (HOSPITAL_COMMUNITY): Payer: 59

## 2019-09-09 ENCOUNTER — Emergency Department (HOSPITAL_COMMUNITY)
Admission: EM | Admit: 2019-09-09 | Discharge: 2019-09-09 | Disposition: A | Discharge status: Home / Self Care | Payer: 59 | Attending: Emergency Medicine | Admitting: Emergency Medicine

## 2019-09-09 DIAGNOSIS — U071 COVID-19: Secondary | ICD-10-CM | POA: Insufficient documentation

## 2019-09-09 DIAGNOSIS — J069 Acute upper respiratory infection, unspecified: Secondary | ICD-10-CM

## 2019-09-09 DIAGNOSIS — Z20822 Contact with and (suspected) exposure to covid-19: Secondary | ICD-10-CM

## 2019-09-09 DIAGNOSIS — R509 Fever, unspecified: Secondary | ICD-10-CM | POA: Diagnosis present

## 2019-09-09 LAB — GROUP A STREP BY PCR: Group A Strep by PCR: NOT DETECTED

## 2019-09-09 LAB — SARS CORONAVIRUS 2 BY RT PCR (HOSPITAL ORDER, PERFORMED IN ~~LOC~~ HOSPITAL LAB): SARS Coronavirus 2: POSITIVE — AB

## 2019-09-09 MED ORDER — ACETAMINOPHEN 325 MG PO TABS
650.0000 mg | ORAL_TABLET | Freq: Once | ORAL | Status: AC
  Administered 2019-09-09: 650 mg via ORAL
  Filled 2019-09-09: qty 2

## 2019-09-09 MED ORDER — BENZONATATE 100 MG PO CAPS
100.0000 mg | ORAL_CAPSULE | Freq: Three times a day (TID) | ORAL | 0 refills | Status: DC
Start: 2019-09-09 — End: 2020-07-14

## 2019-09-09 NOTE — ED Provider Notes (Addendum)
[facility] EMERGENCY DEPARTMENT Provider Note   CSN: [id number] Arrival date & time: 09/09/19  1221     History Chief Complaint  Patient presents with  . Fever  . Generalized Body Aches    Jonathan Mckay is a 24 y.o. male history of GERD, cleft palate.  Patient presents today for URI symptoms onset yesterday. He describes tactile fevers at home but has not measured temperature. Generalized body aches described as an mild aching sensation nonradiating no clear aggravating or alleviating factors. He has not taken any medication for his symptoms. He reports a mild cough which has been nonproductive since yesterday. He also reports mild sore throat a scratchy sensation when he swallows bilaterally. Associated symptom of loss of taste and smell.  Patient denies headache, vision changes, neck stiffness, difficulty swallowing, voice change, hemoptysis/shortness of breath, chest pain, nausea/vomiting, diarrhea, extremity swelling/color change or any additional concerns.  Patient reports that he has not had the COVID-19 vaccine and does not know of any exposures.  HPI     Past Medical History:  Diagnosis Date  . Eosinophilic esophagitis   . GERD (gastroesophageal reflux disease)   . History of Clostridium difficile    2012  . Nasal congestion   . Non-productive cough   . Right knee meniscal tear   . Sinusitis, acute     Patient Active Problem List   Diagnosis Date Noted  . S/P right knee arthroscopy 05/26/2013  . Eosinophilic esophagitis 09/08/2012  . Difficulty swallowing 09/05/2012  . Abdominal pain   . C. difficile diarrhea   . Blood in stool     Past Surgical History:  Procedure Laterality Date  . CLEFT PALATE REPAIR  age 78 mon old  &  21 mon old   and cleft lip repair  . DIRECT LARYNGOSCOPY  08-21-2006   W/  ESOPHAGOSCOPY AND BRONCHOSCOPY AND REMOVAL FORGEIN BODY  . ESOPHAGOGASTRODUODENOSCOPY N/A 06/20/2014   Procedure: ESOPHAGOGASTRODUODENOSCOPY (EGD);  Surgeon:  Malissa Hippo, MD;  Location: [facility];  Service: Endoscopy;  Laterality: N/A;  200  . ESOPHAGOSCOPY N/A 09/05/2012   Procedure: ESOPHAGOSCOPY Foreign Body Removal;  Surgeon: Jon Gills, MD;  Location: [facility] OR;  Service: Gastroenterology;  Laterality: N/A;  . KNEE ARTHROSCOPY Right 05/26/2013   Procedure: ARTHROSCOPY RIGHT KNEE WITH DEBRIDEMENT  PARTIAL MENISECTOMY REMOVAL OF FOREIGN BODY ;  Surgeon: Eugenia Mcalpine, MD;  Location: [facility];  Service: Orthopedics;  Laterality: Right;       Family History  Problem Relation Age of Onset  . Stroke Other   . Asthma Other   . Cancer Other   . Inflammatory bowel disease Neg Hx     Social History   Tobacco Use  . Smoking status: Never Smoker  . Smokeless tobacco: Never Used  Substance Use Topics  . Alcohol use: No  . Drug use: No    Home Medications Prior to Admission medications   Medication Sig Start Date End Date Taking? Authorizing Provider  acetaminophen (TYLENOL) 325 MG tablet Take 650 mg by mouth every 6 (six) hours as needed.    [provider]  benzonatate (TESSALON) 100 MG capsule Take 1 capsule (100 mg total) by mouth every 8 (eight) hours. 09/09/19   Harlene Salts A, PA-C  doxycycline (VIBRA-TABS) 100 MG tablet Take 1 tablet (100 mg total) by mouth 2 (two) times daily. 03/03/18   Elvina Sidle, MD  esomeprazole (NEXIUM) 20 MG capsule Take 20 mg by mouth daily at 12 noon.  [provider]  HYDROcodone-acetaminophen (NORCO/VICODIN) 5-325 MG tablet Take 1-2 tablets by mouth every 4 (four) hours as needed. 11/02/17   Gilda Crease, MD  ibuprofen (ADVIL,MOTRIN) 800 MG tablet Take 1 tablet (800 mg total) by mouth every 6 (six) hours as needed. 11/02/17   Gilda Crease, MD  naproxen (NAPROSYN) 500 MG tablet Take 1 tablet (500 mg total) by mouth 2 (two) times daily. 05/04/19   Arlyn Dunning, PA-C  oseltamivir (TAMIFLU) 75 MG capsule Take 75 mg by mouth.    [provider]  predniSONE (DELTASONE) 5 MG tablet Take 1 tablet (5 mg total) by mouth as directed. Take 6 pills today, decrease by one pill until done 04/24/18   Demetrio Lapping, PA-C    Allergies    Amoxicillin, Penicillins, Shellfish allergy, Clindamycin/lincomycin, Vancomycin, and Benadryl [diphenhydramine hcl]  Review of Systems   Review of Systems Ten systems are reviewed and are negative for acute change except as noted in the HPI Physical Exam Updated Vital Signs BP 135/71 (BP Location: Right Arm)   Pulse (!) 102   Temp 100.1 F (37.8 C) (Oral)   Resp 18   Ht 5\' 11"  (1.803 m)   Wt 72.6 kg   SpO2 96%   BMI 22.32 kg/m   Physical Exam Constitutional:      General: He is not in acute distress.    Appearance: Normal appearance. He is well-developed. He is not ill-appearing or diaphoretic.  HENT:     Head: Normocephalic and atraumatic.     Jaw: There is normal jaw occlusion.     Right Ear: Tympanic membrane and external ear normal.     Left Ear: Tympanic membrane and external ear normal.     Nose: Rhinorrhea present. Rhinorrhea is clear.     Right Sinus: No maxillary sinus tenderness or frontal sinus tenderness.     Left Sinus: No maxillary sinus tenderness or frontal sinus tenderness.     Mouth/Throat:     Comments: Cleft Palate. Mild postnasal drip. The patient has normal phonation and is in control of secretions. No stridor.  Midline uvula without edema. Soft palate rises symmetrically. No tonsillar erythema, swelling or exudates. Tongue protrusion is normal, floor of mouth is soft. No trismus. No creptius on neck palpation. No gingival erythema or fluctuance noted. Mucus membranes moist. No pallor noted. Eyes:     General: Vision grossly intact. Gaze aligned appropriately.     Extraocular Movements: Extraocular movements intact.     Pupils: Pupils are equal, round, and reactive to light.  Neck:     Trachea: Trachea and phonation normal. No tracheal tenderness or tracheal  deviation.     Meningeal: Brudzinski's sign absent.  Cardiovascular:     Rate and Rhythm: Normal rate and regular rhythm.     Pulses: Normal pulses.  Pulmonary:     Effort: Pulmonary effort is normal. No respiratory distress.     Breath sounds: Normal breath sounds.  Abdominal:     General: There is no distension.     Palpations: Abdomen is soft.     Tenderness: There is no abdominal tenderness. There is no guarding or rebound.  Musculoskeletal:        General: Normal range of motion.     Cervical back: Normal range of motion and neck supple.  Skin:    General: Skin is warm and dry.  Neurological:     Mental Status: He is alert.     GCS: GCS  eye subscore is 4. GCS verbal subscore is 5. GCS motor subscore is 6.     Comments: Speech is clear and goal oriented, follows commands Major Cranial nerves without deficit, no facial droop Moves extremities without ataxia, coordination intact  Psychiatric:        Behavior: Behavior normal.     ED Results / Procedures / Treatments   Labs (all labs ordered are listed, but only abnormal results are displayed) Labs Reviewed  GROUP A STREP BY PCR  SARS CORONAVIRUS 2 BY RT PCR (HOSPITAL ORDER[facility])    EKG None  Radiology DG Chest Portable 1 View  Result Date: 09/09/2019 CLINICAL DATA:  Fever and cough EXAM: PORTABLE CHEST 1 VIEW COMPARISON:  None. FINDINGS: Normal mediastinum and cardiac silhouette. Normal pulmonary vasculature. No evidence of effusion, infiltrate, or pneumothorax. No acute bony abnormality. IMPRESSION: Normal chest radiograph. Electronically Signed   By: Genevive BiStewart  Edmunds M.D.   On: 09/09/2019 14:14    Procedures Procedures (including critical care time)  Medications Ordered in ED Medications  acetaminophen (TYLENOL) tablet 650 mg (650 mg Oral Given 09/09/19 1424)    ED Course  I have reviewed the triage vital signs and the nursing notes.  Pertinent labs & imaging results that were  available during my care of the patient were reviewed by me and considered in my medical decision making (see chart for details).    MDM Rules/Calculators/A&P                         Additional history obtained from: 1. Nursing notes from this visit. -------- 24 year old male history as above presents today for URI symptoms onset yesterday. He has not had his Covid vaccine but has no known exposures. On examination he is well-appearing no acute distress vital signs within normal limits. Cranial nerves intact, no meningeal signs. TMs clear bilaterally. Airway is intact mild postnasal drip without evidence of PTA, RPA, Ludwick's or other deep space infections of the head/neck. Cardiopulmonary examination within normal limits. Abdomen soft nontender without peritoneal signs. Neurovascular intact to all 4 extremities without evidence of DVT. Triage vital signs show heart rate of 102 this improved on my examination heart rate was in the 80s. Low-grade temperature of 100.1 F on arrival without prior antipyretic use. Suspect patient with likely viral URI will obtain Covid test. Patient is nonseptic/toxic appearing additionally will add strep test and chest x-ray and give patient Tylenol for symptoms. - Strep test negative.  CXR:  IMPRESSION:  Normal chest radiograph.  - No evidence of bacterial infection requiring antibiotics at this time. Suspect patient with viral upper respiratory tract infection will encourage water hydration and OTC anti-inflammatories. Patient's Covid test is pending he will follow-up on the results of his MyChart account and discuss them with his PCP. Patient aware that he needs to quarantine for 10 days if Covid test is positive, he is also encouraged to quarantine even if his test is negative to avoid spread of other viruses as well.  At this time there does not appear to be any evidence of an acute emergency medical condition and the patient appears stable for discharge with  appropriate outpatient follow up. Diagnosis was discussed with patient who verbalizes understanding of care plan and is agreeable to discharge. I have discussed return precautions with patient  who verbalizes understanding. Patient encouraged to follow-up with their PCP. All questions answered.  Laurita QuintNoah C Mckay was evaluated in Emergency Department  on 09/09/2019 for the symptoms described in the history of present illness. He was evaluated in the context of the global COVID-19 pandemic, which necessitated consideration that the patient might be at risk for infection with the SARS-CoV-2 virus that causes COVID-19. Institutional protocols and algorithms that pertain to the evaluation of patients at risk for COVID-19 are in a state of rapid change based on information released by regulatory bodies including the CDC and federal and state organizations. These policies and algorithms were followed during the patient's care in the ED.  Note: Portions of this report may have been transcribed using voice recognition software. Every effort was made to ensure accuracy; however, inadvertent computerized transcription errors may still be present. Final Clinical Impression(s) / ED Diagnoses Final diagnoses:  Viral URI with cough  Suspected COVID-19 virus infection    Rx / DC Orders ED Discharge Orders         Ordered    benzonatate (TESSALON) 100 MG capsule  Every 8 hours     Discontinue  Reprint     09/09/19 1530           Bill Salinas, PA-C 09/09/19 1529    Bill Salinas, PA-C 09/09/19 1531    Bethann Berkshire, MD 09/12/19 1020

## 2019-09-09 NOTE — ED Triage Notes (Signed)
Fever, body aches

## 2019-09-09 NOTE — Discharge Instructions (Addendum)
At this time there does not appear to be the presence of an emergent medical condition, however there is always the potential for conditions to change. Please read and follow the below instructions.  Please return to the Emergency Department immediately for any new or worsening symptoms. Please be sure to follow up with your Primary Care Provider within one week regarding your visit today; please call their office to schedule an appointment even if you are feeling better for a follow-up visit. Your Covid test is pending you can check your results on your MyChart account in the next 1 day. Please discuss these results with your primary care doctor when available. Please drink plenty water and get plenty of rest. You may use over-the-counter anti-inflammatory such as Tylenol as directed on the packaging to help with your symptoms. If your Covid test is positive please quarantine for the next 10 days, until August 17 to avoid spread of the illness. I encourage you to quarantine even if your Covid test is negative to avoid spread of other viruses as well until symptom-free. You may use the medication Tessalon as prescribed to help with your cough.  Get help right away if: You have shortness of breath that gets worse. You have very bad or constant: Headache. Ear pain. Pain in your forehead, behind your eyes, and over your cheekbones (sinus pain). Chest pain. You have long-lasting (chronic) lung disease along with any of these: Wheezing. Long-lasting cough. Coughing up blood. A change in your usual mucus. You have a stiff neck. You have changes in your: Vision. Hearing. Thinking. Mood. You have any new/concerning or worsening of symptoms   Please read the additional information packets attached to your discharge summary.  Do not take your medicine if  develop an itchy rash, swelling in your mouth or lips, or difficulty breathing; call 911 and seek immediate emergency medical attention if this  occurs.  You may review your lab tests and imaging results in their entirety on your MyChart account.  Please discuss all results of fully with your primary care provider and other specialist at your follow-up visit.  Note: Portions of this text may have been transcribed using voice recognition software. Every effort was made to ensure accuracy; however, inadvertent computerized transcription errors may still be present.

## 2019-10-18 ENCOUNTER — Ambulatory Visit: Payer: 59 | Admitting: Orthopaedic Surgery

## 2020-06-07 ENCOUNTER — Other Ambulatory Visit: Payer: Self-pay

## 2020-06-07 ENCOUNTER — Encounter (HOSPITAL_COMMUNITY): Payer: Self-pay | Admitting: *Deleted

## 2020-06-07 ENCOUNTER — Emergency Department (HOSPITAL_COMMUNITY)
Admission: EM | Admit: 2020-06-07 | Discharge: 2020-06-07 | Disposition: A | Discharge status: Home / Self Care | Payer: 59 | Attending: Emergency Medicine | Admitting: Emergency Medicine

## 2020-06-07 ENCOUNTER — Emergency Department (HOSPITAL_COMMUNITY): Payer: 59

## 2020-06-07 DIAGNOSIS — S62633B Displaced fracture of distal phalanx of left middle finger, initial encounter for open fracture: Secondary | ICD-10-CM | POA: Diagnosis not present

## 2020-06-07 DIAGNOSIS — S6992XA Unspecified injury of left wrist, hand and finger(s), initial encounter: Secondary | ICD-10-CM | POA: Diagnosis present

## 2020-06-07 DIAGNOSIS — W208XXA Other cause of strike by thrown, projected or falling object, initial encounter: Secondary | ICD-10-CM | POA: Insufficient documentation

## 2020-06-07 DIAGNOSIS — S62639B Displaced fracture of distal phalanx of unspecified finger, initial encounter for open fracture: Secondary | ICD-10-CM

## 2020-06-07 DIAGNOSIS — S61313A Laceration without foreign body of left middle finger with damage to nail, initial encounter: Secondary | ICD-10-CM

## 2020-06-07 MED ORDER — OXYCODONE-ACETAMINOPHEN 5-325 MG PO TABS
1.0000 | ORAL_TABLET | Freq: Once | ORAL | Status: AC
  Administered 2020-06-07: 1 via ORAL
  Filled 2020-06-07: qty 1

## 2020-06-07 MED ORDER — LIDOCAINE HCL (PF) 1 % IJ SOLN
5.0000 mL | Freq: Once | INTRAMUSCULAR | Status: AC
  Administered 2020-06-07: 5 mL
  Filled 2020-06-07: qty 30

## 2020-06-07 MED ORDER — CEPHALEXIN 500 MG PO CAPS
500.0000 mg | ORAL_CAPSULE | Freq: Four times a day (QID) | ORAL | 0 refills | Status: AC
Start: 2020-06-07 — End: 2020-06-14

## 2020-06-07 MED ORDER — OXYCODONE-ACETAMINOPHEN 5-325 MG PO TABS: 1.0000 | ORAL_TABLET | Freq: Four times a day (QID) | ORAL | 0 refills | Status: DC | PRN

## 2020-06-07 NOTE — ED Provider Notes (Addendum)
[facility] EMERGENCY DEPARTMENT Provider Note   CSN: [id number] Arrival date & time: 06/07/20  1845     History Chief Complaint  Patient presents with  . Laceration    Jonathan Mckay is a 25 y.o. male who presents to the ED today with left middle finger injury that occurred 5-6 hours ago. Pt states that he was moving a full fuel injector off the bed of a truck when it fell and landed ontop of his middle finger, causing a laceration. Pt states he was in the [employer] and the closest hospital was about 1.5 hours away. He decided to drive home 5 hours to be evaluated. Bleeding somewhat controlled on arrival. His tetanus is UTD. No other complaints at this time.   The history is provided by the patient and medical records.       Past Medical History:  Diagnosis Date  . Eosinophilic esophagitis   . GERD (gastroesophageal reflux disease)   . History of Clostridium difficile    2012  . Nasal congestion   . Non-productive cough   . Right knee meniscal tear   . Sinusitis, acute     Patient Active Problem List   Diagnosis Date Noted  . S/P right knee arthroscopy 05/26/2013  . Eosinophilic esophagitis 09/08/2012  . Difficulty swallowing 09/05/2012  . Abdominal pain   . C. difficile diarrhea   . Blood in stool     Past Surgical History:  Procedure Laterality Date  . CLEFT PALATE REPAIR  age 2 mon old  &  65 mon old   and cleft lip repair  . DIRECT LARYNGOSCOPY  08-21-2006   W/  ESOPHAGOSCOPY AND BRONCHOSCOPY AND REMOVAL FORGEIN BODY  . ESOPHAGOGASTRODUODENOSCOPY N/A 06/20/2014   Procedure: ESOPHAGOGASTRODUODENOSCOPY (EGD);  Surgeon: Malissa Hippo, MD;  [facility];  Service: Endoscopy;  Laterality: N/A;  200  . ESOPHAGOSCOPY N/A 09/05/2012   Procedure: ESOPHAGOSCOPY Foreign Body Removal;  Surgeon: Jon Gills, MD;  Location[facility] OR;  Service: Gastroenterology;  Laterality: N/A;  . jaw Right    plate to right lower jaw due to fx  . KNEE ARTHROSCOPY Right  05/26/2013   Procedure: ARTHROSCOPY RIGHT KNEE WITH DEBRIDEMENT  PARTIAL MENISECTOMY REMOVAL OF FOREIGN BODY ;  Surgeon: Eugenia Mcalpine, MD;  Location: [facility];  Service: Orthopedics;  Laterality: Right;       Family History  Problem Relation Age of Onset  . Stroke Other   . Asthma Other   . Cancer Other   . Inflammatory bowel disease Neg Hx     Social History   Tobacco Use  . Smoking status: Never Smoker  . Smokeless tobacco: Never Used  Vaping Use  . Vaping Use: Never used  Substance Use Topics  . Alcohol use: Yes    Comment: occasionally  . Drug use: No    Home Medications Prior to Admission medications   Medication Sig Start Date End Date Taking? Authorizing Provider  cephALEXin (KEFLEX) 500 MG capsule Take 1 capsule (500 mg total) by mouth 4 (four) times daily for 7 days. 06/07/20 06/14/20 Yes Saamir Armstrong, PA-C  oxyCODONE-acetaminophen (PERCOCET/ROXICET) 5-325 MG tablet Take 1 tablet by mouth every 6 (six) hours as needed for severe pain. 06/07/20  Yes Kasyn Stouffer, PA-C  acetaminophen (TYLENOL) 325 MG tablet Take 650 mg by mouth every 6 (six) hours as needed.    [provider]  benzonatate (TESSALON) 100 MG capsule Take 1 capsule (100 mg total) by mouth  every 8 (eight) hours. 09/09/19   Harlene Salts A, PA-C  doxycycline (VIBRA-TABS) 100 MG tablet Take 1 tablet (100 mg total) by mouth 2 (two) times daily. 03/03/18   Elvina Sidle, MD  esomeprazole (NEXIUM) 20 MG capsule Take 20 mg by mouth daily at 12 noon.    [provider]  HYDROcodone-acetaminophen (NORCO/VICODIN) 5-325 MG tablet Take 1-2 tablets by mouth every 4 (four) hours as needed. 11/02/17   Gilda Crease, MD  ibuprofen (ADVIL,MOTRIN) 800 MG tablet Take 1 tablet (800 mg total) by mouth every 6 (six) hours as needed. 11/02/17   Gilda Crease, MD  naproxen (NAPROSYN) 500 MG tablet Take 1 tablet (500 mg total) by mouth 2 (two) times daily. 05/04/19   Arlyn Dunning, PA-C  oseltamivir (TAMIFLU) 75 MG capsule Take 75 mg by mouth.    [provider]  predniSONE (DELTASONE) 5 MG tablet Take 1 tablet (5 mg total) by mouth as directed. Take 6 pills today, decrease by one pill until done 04/24/18   Demetrio Lapping, PA-C    Allergies    Amoxicillin, Penicillins, Shellfish allergy, Clindamycin/lincomycin, Vancomycin, and Benadryl [diphenhydramine hcl]  Review of Systems   Review of Systems  Constitutional: Negative for chills and fever.  Musculoskeletal: Positive for arthralgias.  Skin: Positive for wound.  All other systems reviewed and are negative.   Physical Exam Updated Vital Signs BP 130/81 (BP Location: Right Arm)   Pulse 61   Temp 98.1 F (36.7 C) (Oral)   Resp 18   Ht 5\' 11"  (1.803 m)   Wt 70.3 kg   SpO2 96%   BMI 21.62 kg/m   Physical Exam Vitals and nursing note reviewed.  Constitutional:      Appearance: He is not ill-appearing.  HENT:     Head: Normocephalic and atraumatic.  Eyes:     Conjunctiva/sclera: Conjunctivae normal.  Cardiovascular:     Rate and Rhythm: Normal rate and regular rhythm.  Pulmonary:     Effort: Pulmonary effort is normal.     Breath sounds: Normal breath sounds.  Abdominal:     Palpations: Abdomen is soft.     Tenderness: There is no abdominal tenderness.  Musculoskeletal:     Cervical back: Neck supple.     Comments: See photo below  Skin:    General: Skin is warm and dry.  Neurological:     Mental Status: He is alert.       ED Results / Procedures / Treatments   Labs (all labs ordered are listed, but only abnormal results are displayed) Labs Reviewed - No data to display  EKG None  Radiology DG Finger Middle Left  Result Date: 06/07/2020 CLINICAL DATA:  Status post trauma. EXAM: LEFT MIDDLE FINGER 2+V COMPARISON:  None. FINDINGS: Acute, comminuted fracture deformity is seen involving the tuft of the distal phalanx of the third left finger. A surrounding soft tissue  deformity is seen. There is no evidence of dislocation. IMPRESSION: Acute fracture of the distal phalanx of the third left finger. Electronically Signed   By: 08/07/2020 M.D.   On: 06/07/2020 19:28    Procedures .07/06/2022Laceration Repair  Date/Time: 06/07/2020 9:14 PM Performed by: 08/07/2020, PA-C Authorized by: Tanda Rockers, PA-C   Consent:    Consent obtained:  Verbal   Consent given by:  Patient   Risks discussed:  Infection, pain and poor cosmetic result Anesthesia:    Anesthesia method:  Nerve block   Block needle gauge:  25 G   Block anesthetic:  Lidocaine 1% WITH epi   Block injection procedure:  Anatomic landmarks identified   Block outcome:  Anesthesia achieved Laceration details:    Location:  Finger   Finger location:  L long finger   Length (cm):  2 Pre-procedure details:    Preparation:  Patient was prepped and draped in usual sterile fashion Treatment:    Area cleansed with:  Povidone-iodine   Irrigation solution:  Sterile saline Skin repair:    Repair method:  Sutures   Suture size:  4-0   Suture material:  Prolene   Suture technique:  Simple interrupted   Number of sutures:  4 Approximation:    Approximation:  Loose Repair type:    Repair type:  Complex Post-procedure details:    Dressing:  Non-adherent dressing and splint for protection   Procedure completion:  Tolerated well, no immediate complications     Medications Ordered in ED Medications  oxyCODONE-acetaminophen (PERCOCET/ROXICET) 5-325 MG per tablet 1 tablet (has no administration in time range)  lidocaine (PF) (XYLOCAINE) 1 % injection 5 mL (5 mLs Infiltration Given by Other 06/07/20 2020)    ED Course  I have reviewed the triage vital signs and the nursing notes.  Pertinent labs & imaging results that were available during my care of the patient were reviewed by me and considered in my medical decision making (see chart for details).    MDM Rules/Calculators/A&P                           25 year old male who presents to the ED today after sustaining a left middle finger injury.  Large heavy object fell onto finger and smashed it causing laceration.  He drove 5 hours from [employer] to be evaluated.  On arrival vitals are stable.  Patient appears to be in no acute distress.  He has a laceration to the distal aspect of his left middle finger, appears to go through the nailbed.  Nail with subungual hematoma likely due to the amount of pressure.  It does appear that the amount of pressure because the finger to essentially pop and caused the laceration.  He is neurovascularly intact with good distal pulse.  Will obtain x-ray at this time as I suspect open fracture.  Tetanus is up-to-date.  Xray with distal tuft fracture. Will prescribe abx in the outpatient setting. Pt has allergy to PCN however appears he has had rocephin in the past in both 2018 and 2019; will discharge with keflex.   Nerve block performed. Upon further inspection nail bed is macerated; no obvious laceration to repair.   Discussed case with Dr. Roney Mans; recommends extensive irrigation, approximation as best as possible, attempt to close the nailbed if possible and close outpatient follow up next week on Tuesday in the clinic. Should call Monday for appt. Recommends keflex.   #4 4-0 prolene sutures placed to approximate laceration; tacked nail down however no appreciable nailbed laceration to close at this time as it is all macerated. Bulky dressing applied with splint for protection. Pt to follow up with Dr. Roney Mans on Tuesday for further eval. Will discharge with abx and pain meds. Pt in agreement with plan and stable for discharge home.   This note was prepared using Dragon voice recognition software and may include unintentional dictation errors due to the inherent limitations of voice recognition software.  Final Clinical Impression(s) / ED Diagnoses Final diagnoses:  Laceration of left  middle finger  without foreign body with damage to nail, initial encounter  Open fracture of tuft of distal phalanx of finger    Rx / DC Orders ED Discharge Orders         Ordered    oxyCODONE-acetaminophen (PERCOCET/ROXICET) 5-325 MG tablet  Every 6 hours PRN        06/07/20 2120    cephALEXin (KEFLEX) 500 MG capsule  4 times daily        06/07/20 2120           Discharge Instructions     Please call Dr. Hinda Glatterreighton's office on Monday to schedule an appointment for Tuesday for further evaluation.  Keep wound clean until you can see Dr. Roney Mansreighton. Wear splint for protection.  Pick up antibiotics and take as prescribed to cover for infection. Take pain medication as needed.   Return to the ED for any new symptoms       Jonathan Mckay, Jonathan Finnie, PA-C 06/07/20 2122    Eber HongMiller, Brian, MD 06/08/20 1450    Tanda RockersVenter, Langdon Crosson, PA-C 07/10/20 1434    Eber HongMiller, Brian, MD 07/13/20 1505
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
## 2020-06-07 NOTE — Discharge Instructions (Signed)
Please call Dr. Hinda Glatter office on Monday to schedule an appointment for Tuesday for further evaluation.  Keep wound clean until you can see Dr. Roney Mans. Wear splint for protection.  Pick up antibiotics and take as prescribed to cover for infection. Take pain medication as needed.   Return to the ED for any new symptoms

## 2020-07-14 ENCOUNTER — Encounter: Payer: Self-pay | Admitting: Emergency Medicine

## 2020-07-14 ENCOUNTER — Ambulatory Visit
Admission: EM | Admit: 2020-07-14 | Discharge: 2020-07-14 | Disposition: A | Discharge status: Home / Self Care | Payer: 59 | Attending: Emergency Medicine | Admitting: Emergency Medicine

## 2020-07-14 DIAGNOSIS — Z20822 Contact with and (suspected) exposure to covid-19: Secondary | ICD-10-CM

## 2020-07-14 DIAGNOSIS — B349 Viral infection, unspecified: Secondary | ICD-10-CM | POA: Diagnosis present

## 2020-07-14 LAB — POCT RAPID STREP A (OFFICE): Rapid Strep A Screen: NEGATIVE

## 2020-07-14 MED ORDER — ONDANSETRON 8 MG PO TBDP
ORAL_TABLET | ORAL | 0 refills | Status: DC
Start: 2020-07-14 — End: 2022-01-19

## 2020-07-14 MED ORDER — FLUTICASONE PROPIONATE 50 MCG/ACT NA SUSP
2.0000 | Freq: Every day | NASAL | 0 refills | Status: DC
Start: 2020-07-14 — End: 2022-01-19

## 2020-07-14 MED ORDER — IBUPROFEN 600 MG PO TABS
600.0000 mg | ORAL_TABLET | Freq: Four times a day (QID) | ORAL | 0 refills | Status: DC | PRN
Start: 2020-07-14 — End: 2022-01-19

## 2020-07-14 MED ORDER — OSELTAMIVIR PHOSPHATE 75 MG PO CAPS
75.0000 mg | ORAL_CAPSULE | Freq: Two times a day (BID) | ORAL | 0 refills | Status: DC
Start: 2020-07-14 — End: 2022-01-19

## 2020-07-14 NOTE — Discharge Instructions (Addendum)
Finish the Tamiflu, take 1000 mg of Tylenol combined with 600 mg of ibuprofen 3-4 times a day as needed for body aches, headaches, saline nasal irrigation with a Lloyd Huger Med rinse and distilled water as often as you want, Mucinex D, Flonase for nasal congestion.  Zofran as needed for nausea, push electrolyte containing fluids such as Pedialyte or Gatorade.  It is fine if you do not eat for several days.  Stop all other cold medications.  We will contact you if your strep culture, flu or COVID come back positive.

## 2020-07-14 NOTE — ED Triage Notes (Signed)
Headache, body aches, sore throat, nauseated x 2 days.

## 2020-07-14 NOTE — ED Provider Notes (Signed)
HPI  SUBJECTIVE:  Jonathan Mckay is a 25 y.o. male who presents with 2 days of body aches, headaches, sore throat, mild cough, questionable postnasal drip, nausea, diarrhea, abdominal pain.  No fevers, nasal congestion, loss of sense or smell or taste, shortness of breath, vomiting.  No known COVID or flu exposure.  He did not get the COVID or flu vaccines.  He was on Keflex recently for a finger laceration.  No antipyretic in the past 6 hours.  He has tried ibuprofen 600 mg, DayQuil, NyQuil with improvement in his symptoms.  Symptoms are worse with being active.  He has a past medical history of GERD, COVID in August 21, frequent strep in childhood and is status post cleft palate repair.  No history of diabetes, hypertension, asthma.  PMD: None    Past Medical History:  Diagnosis Date   Eosinophilic esophagitis    GERD (gastroesophageal reflux disease)    History of Clostridium difficile    2012   Nasal congestion    Non-productive cough    Right knee meniscal tear    Sinusitis, acute     Past Surgical History:  Procedure Laterality Date   CLEFT PALATE REPAIR  age 5 mon old  &  17 mon old   and cleft lip repair   DIRECT LARYNGOSCOPY  08-21-2006   W/  ESOPHAGOSCOPY AND BRONCHOSCOPY AND REMOVAL FORGEIN BODY   ESOPHAGOGASTRODUODENOSCOPY N/A 06/20/2014   Procedure: ESOPHAGOGASTRODUODENOSCOPY (EGD);  Surgeon: Malissa Hippo, MD;  [facility]: [facility];  Service: Endoscopy;  Laterality: N/A;  200   ESOPHAGOSCOPY N/A 09/05/2012   Procedure: ESOPHAGOSCOPY Foreign Body Removal;  Surgeon: Jon Gills, MD;  Location: [facility] OR;  Service: Gastroenterology;  Laterality: N/A;   jaw Right    plate to right lower jaw due to fx   KNEE ARTHROSCOPY Right 05/26/2013   Procedure: ARTHROSCOPY RIGHT KNEE WITH DEBRIDEMENT  PARTIAL MENISECTOMY REMOVAL OF FOREIGN BODY ;  Surgeon: Eugenia Mcalpine, MD;  Location: [facility];  Service: Orthopedics;  Laterality: Right;    Family History  Problem  Relation Age of Onset   Stroke Other    Asthma Other    Cancer Other    Inflammatory bowel disease Neg Hx     Social History   Tobacco Use   Smoking status: Never   Smokeless tobacco: Never  Vaping Use   Vaping Use: Never used  Substance Use Topics   Alcohol use: Yes    Comment: occasionally   Drug use: No    No current facility-administered medications for this encounter.  Current Outpatient Medications:    fluticasone (FLONASE) 50 MCG/ACT nasal spray, Place 2 sprays into both nostrils daily., Disp: 16 g, Rfl: 0   ibuprofen (ADVIL) 600 MG tablet, Take 1 tablet (600 mg total) by mouth every 6 (six) hours as needed., Disp: 30 tablet, Rfl: 0   ondansetron (ZOFRAN ODT) 8 MG disintegrating tablet, 1/2- 1 tablet q 8 hr prn nausea, vomiting, Disp: 20 tablet, Rfl: 0   oseltamivir (TAMIFLU) 75 MG capsule, Take 1 capsule (75 mg total) by mouth 2 (two) times daily. X 5 days, Disp: 10 capsule, Rfl: 0   esomeprazole (NEXIUM) 20 MG capsule, Take 20 mg by mouth daily at 12 noon., Disp: , Rfl:   Allergies  Allergen Reactions   Amoxicillin Anaphylaxis   Penicillins Anaphylaxis    Has patient had a PCN reaction causing immediate rash, facial/tongue/throat swelling, SOB or lightheadedness with hypotension: Yes Has patient had  a PCN reaction causing severe rash involving mucus membranes or skin necrosis: No Has patient had a PCN reaction that required hospitalization: Yes Has patient had a PCN reaction occurring within the last 10 years: No If all of the above answers are "NO", then may proceed with Cephalosporin use.    Shellfish Allergy Anaphylaxis    All types   Clindamycin/Lincomycin    Vancomycin Hives   Benadryl [Diphenhydramine Hcl] Hives and Swelling     ROS  As noted in HPI.   Physical Exam  BP 115/80 (BP Location: Right Arm)   Pulse 72   Temp 99.7 F (37.6 C) (Temporal)   Resp 16   SpO2 96%   Constitutional: Well developed, well nourished, no acute distress Eyes:  PERRL, EOMI, conjunctiva normal bilaterally HENT: Normocephalic, atraumatic,mucus membranes moist.  Mucoid nasal congestion.  Erythematous, swollen turbinates.  No maxillary, frontal sinus tenderness.  Positive cleft palate.  Tonsils normal.  Extensive postnasal drip. Neck: Positive cervical lymphadenopathy.  No meningismus.  Positive bilateral trapezial tenderness. Respiratory: Clear to auscultation bilaterally, no rales, no wheezing, no rhonchi Cardiovascular: Normal rate and rhythm, no murmurs, no gallops, no rubs GI: Soft, nondistended, normal bowel sounds, nontender, no rebound, no guarding skin: No rash, skin intact Musculoskeletal: No edema, no tenderness, no deformities Neurologic: Alert & oriented x 3, CN III-XII grossly intact, no motor deficits, sensation grossly intact Psychiatric: Speech and behavior appropriate   ED Course   Medications - No data to display  Orders Placed This Encounter  Procedures   Covid-19, Flu A+B ([facility])    Standing Status:   Standing    Number of Occurrences:   1   Culture, group A strep    Standing Status:   Standing    Number of Occurrences:   1   POCT rapid strep A    Standing Status:   Standing    Number of Occurrences:   1   Results for orders placed or performed during the hospital encounter of 07/14/20 (from the past 24 hour(s))  POCT rapid strep A     Status: None   Collection Time: 07/14/20  4:35 PM  Result Value Ref Range   Rapid Strep A Screen Negative Negative   No results found.  ED Clinical Impression  1. Viral illness   2. Exposure to COVID-19 virus   3. Encounter for laboratory testing for COVID-19 virus      ED Assessment/Plan  Rapid strep negative.  Sending off throat culture.  Flu, COVID pending.  Will treat as if this is influenza because he will be out of the window for antiviral treatment by the time the flu test comes back.  If he is positive for COVID, he will qualify for antiviral treatment based on  unvaccinated status-recommend Molnupiravir since there is no recent BMP.  Abdomen is benign. Patient with viral illness suspect COVID or flu.  Home with Tamiflu, Tylenol/ibuprofen, saline nasal irrigation, Mucinex D, Flonase, Zofran as needed for nausea, push electrolyte containing fluids.  Stop all other cold medications.  Will order assistance in finding a PMD.  COVID pending at the time of signing of this note  Discussed labs, MDM, treatment plan, and plan for follow-up with patient Discussed sn/sx that should prompt return to the ED. patient agrees with plan.   Meds ordered this encounter  Medications   fluticasone (FLONASE) 50 MCG/ACT nasal spray    Sig: Place 2 sprays into both nostrils daily.    Dispense:  16 g  Refill:  0   ibuprofen (ADVIL) 600 MG tablet    Sig: Take 1 tablet (600 mg total) by mouth every 6 (six) hours as needed.    Dispense:  30 tablet    Refill:  0   oseltamivir (TAMIFLU) 75 MG capsule    Sig: Take 1 capsule (75 mg total) by mouth 2 (two) times daily. X 5 days    Dispense:  10 capsule    Refill:  0   ondansetron (ZOFRAN ODT) 8 MG disintegrating tablet    Sig: 1/2- 1 tablet q 8 hr prn nausea, vomiting    Dispense:  20 tablet    Refill:  0      *This clinic note was created using Scientist, clinical (histocompatibility and immunogenetics). Therefore, there may be occasional mistakes despite careful proofreading. ?    Domenick Gong, MD 07/15/20 1106

## 2020-07-16 LAB — COVID-19, FLU A+B NAA
Influenza A, NAA: NOT DETECTED
Influenza B, NAA: NOT DETECTED
SARS-CoV-2, NAA: NOT DETECTED

## 2020-07-18 LAB — CULTURE, GROUP A STREP (THRC)

## 2020-07-22 ENCOUNTER — Encounter (HOSPITAL_COMMUNITY): Payer: Self-pay

## 2020-11-13 ENCOUNTER — Ambulatory Visit
Admission: EM | Admit: 2020-11-13 | Discharge: 2020-11-13 | Disposition: A | Discharge status: Home / Self Care | Payer: 59 | Attending: Family Medicine | Admitting: Family Medicine

## 2020-11-13 ENCOUNTER — Encounter: Payer: Self-pay | Admitting: Emergency Medicine

## 2020-11-13 ENCOUNTER — Other Ambulatory Visit: Payer: Self-pay

## 2020-11-13 DIAGNOSIS — L237 Allergic contact dermatitis due to plants, except food: Secondary | ICD-10-CM | POA: Diagnosis not present

## 2020-11-13 MED ORDER — PREDNISONE 10 MG PO TABS: ORAL_TABLET | ORAL | 0 refills | Status: DC

## 2020-11-13 MED ORDER — TRIAMCINOLONE ACETONIDE 0.1 % EX CREA
1.0000 | TOPICAL_CREAM | Freq: Two times a day (BID) | CUTANEOUS | 2 refills | Status: DC
Start: 2020-11-13 — End: 2022-01-19

## 2020-11-13 NOTE — ED Triage Notes (Signed)
Poison oak on arms and face x 1 day

## 2020-11-13 NOTE — ED Provider Notes (Signed)
[facility]    CSN: [id number] Arrival date & time: 11/13/20  1643      History   Chief Complaint No chief complaint on file.   HPI Jonathan Mckay is a 25 y.o. male.   Patient presenting today with itchy red rash bilateral arms and starting on the left side of face after doing some outdoor work yesterday where he got into poison oak.  Has not tried anything over-the-counter for symptoms thus far.  Denies fever, chills, difficulty breathing or swallowing, nausea, vomiting.   Past Medical History:  Diagnosis Date   Eosinophilic esophagitis    GERD (gastroesophageal reflux disease)    History of Clostridium difficile    2012   Nasal congestion    Non-productive cough    Right knee meniscal tear    Sinusitis, acute     Patient Active Problem List   Diagnosis Date Noted   S/P right knee arthroscopy 05/26/2013   Eosinophilic esophagitis 09/08/2012   Difficulty swallowing 09/05/2012   Abdominal pain    C. difficile diarrhea    Blood in stool     Past Surgical History:  Procedure Laterality Date   CLEFT PALATE REPAIR  age 48 mon old  &  78 mon old   and cleft lip repair   DIRECT LARYNGOSCOPY  08-21-2006   W/  ESOPHAGOSCOPY AND BRONCHOSCOPY AND REMOVAL FORGEIN BODY   ESOPHAGOGASTRODUODENOSCOPY N/A 06/20/2014   Procedure: ESOPHAGOGASTRODUODENOSCOPY (EGD);  Surgeon: Malissa Hippo, MD;  [facility]: [facility];  Service: Endoscopy;  Laterality: N/A;  200   ESOPHAGOSCOPY N/A 09/05/2012   Procedure: ESOPHAGOSCOPY Foreign Body Removal;  Surgeon: Jon Gills, MD;  Location: [facility] OR;  Service: Gastroenterology;  Laterality: N/A;   jaw Right    plate to right lower jaw due to fx   KNEE ARTHROSCOPY Right 05/26/2013   Procedure: ARTHROSCOPY RIGHT KNEE WITH DEBRIDEMENT  PARTIAL MENISECTOMY REMOVAL OF FOREIGN BODY ;  Surgeon: Eugenia Mcalpine, MD;  Location: [facility] [place];  Service: Orthopedics;  Laterality: Right;       Home Medications    Prior to  Admission medications   Medication Sig Start Date End Date Taking? Authorizing Provider  predniSONE (DELTASONE) 10 MG tablet Take 6 tabs daily x 2 days, 5 tabs daily x 2 days, 4 tabs daily x 2 days, etc 11/13/20  Yes Particia Nearing, PA-C  triamcinolone cream (KENALOG) 0.1 % Apply 1 application topically 2 (two) times daily. 11/13/20  Yes Particia Nearing, PA-C  esomeprazole (NEXIUM) 20 MG capsule Take 20 mg by mouth daily at 12 noon.    [provider]  fluticasone (FLONASE) 50 MCG/ACT nasal spray Place 2 sprays into both nostrils daily. 07/14/20   Domenick Gong, MD  ibuprofen (ADVIL) 600 MG tablet Take 1 tablet (600 mg total) by mouth every 6 (six) hours as needed. 07/14/20   Domenick Gong, MD  ondansetron (ZOFRAN ODT) 8 MG disintegrating tablet 1/2- 1 tablet q 8 hr prn nausea, vomiting 07/14/20   Domenick Gong, MD  oseltamivir (TAMIFLU) 75 MG capsule Take 1 capsule (75 mg total) by mouth 2 (two) times daily. X 5 days 07/14/20   Domenick Gong, MD    Family History Family History  Problem Relation Age of Onset   Stroke Other    Asthma Other    Cancer Other    Inflammatory bowel disease Neg Hx     Social History Social History   Tobacco Use   Smoking status: Never  Smokeless tobacco: Never  Vaping Use   Vaping Use: Never used  Substance Use Topics   Alcohol use: Yes    Comment: occasionally   Drug use: No     Allergies   Amoxicillin, Penicillins, Shellfish allergy, Clindamycin/lincomycin, Vancomycin, and Benadryl [diphenhydramine hcl]   Review of Systems Review of Systems Per HPI  Physical Exam Triage Vital Signs ED Triage Vitals [11/13/20 1805]  Enc Vitals Group     BP 121/68     Pulse Rate (!) 56     Resp 18     Temp 98.1 F (36.7 C)     Temp Source Oral     SpO2 98 %     Weight      Height      Head Circumference      Peak Flow      Pain Score 0     Pain Loc      Pain Edu?      Excl. in GC?    No data  found.  Updated Vital Signs BP 121/68 (BP Location: Right Arm)   Pulse (!) 56   Temp 98.1 F (36.7 C) (Oral)   Resp 18   SpO2 98%   Visual Acuity Right Eye Distance:   Left Eye Distance:   Bilateral Distance:    Right Eye Near:   Left Eye Near:    Bilateral Near:     Physical Exam Vitals and nursing note reviewed.  Constitutional:      Appearance: Normal appearance.  HENT:     Head: Atraumatic.  Eyes:     Extraocular Movements: Extraocular movements intact.     Conjunctiva/sclera: Conjunctivae normal.  Cardiovascular:     Rate and Rhythm: Normal rate and regular rhythm.  Pulmonary:     Effort: Pulmonary effort is normal.     Breath sounds: Normal breath sounds.  Musculoskeletal:        General: Normal range of motion.     Cervical back: Normal range of motion and neck supple.  Skin:    General: Skin is warm and dry.     Findings: Rash present.     Comments: Erythematous maculopapular rash diffusely bilateral upper extremities and beginning on left side of face around the eye  Neurological:     General: No focal deficit present.     Mental Status: He is oriented to person, place, and time.  Psychiatric:        Mood and Affect: Mood normal.        Thought Content: Thought content normal.        Judgment: Judgment normal.     UC Treatments / Results  Labs (all labs ordered are listed, but only abnormal results are displayed) Labs Reviewed - No data to display  EKG   Radiology No results found.  Procedures Procedures (including critical care time)  Medications Ordered in UC Medications - No data to display  Initial Impression / Assessment and Plan / UC Course  I have reviewed the triage vital signs and the nursing notes.  Pertinent labs & imaging results that were available during my care of the patient were reviewed by me and considered in my medical decision making (see chart for details).     Treat with extended prednisone taper, triamcinolone  cream.  Antihistamines recommended additionally.  Follow-up for worsening symptoms.  Final Clinical Impressions(s) / UC Diagnoses   Final diagnoses:  Poison oak dermatitis   Discharge Instructions   None  ED Prescriptions     Medication Sig Dispense Auth. Provider   predniSONE (DELTASONE) 10 MG tablet Take 6 tabs daily x 2 days, 5 tabs daily x 2 days, 4 tabs daily x 2 days, etc 42 tablet Particia Nearing, PA-C   triamcinolone cream (KENALOG) 0.1 % Apply 1 application topically 2 (two) times daily. 80 g Particia Nearing, [place]      PDMP not reviewed this encounter.   Particia Nearing, [place] 11/13/20 1845

## 2021-06-02 ENCOUNTER — Ambulatory Visit
Admission: EM | Admit: 2021-06-02 | Discharge: 2021-06-02 | Disposition: A | Discharge status: Home / Self Care | Payer: 59 | Attending: Family Medicine | Admitting: Family Medicine

## 2021-06-02 DIAGNOSIS — Z20828 Contact with and (suspected) exposure to other viral communicable diseases: Secondary | ICD-10-CM

## 2021-06-02 DIAGNOSIS — J0100 Acute maxillary sinusitis, unspecified: Secondary | ICD-10-CM | POA: Diagnosis not present

## 2021-06-02 MED ORDER — AZITHROMYCIN 250 MG PO TABS: ORAL_TABLET | ORAL | 0 refills | Status: DC

## 2021-06-02 NOTE — ED Provider Notes (Signed)
?[facility] ? ? ? ?CSN: [id number] ?Arrival date & time: 06/02/21  1430 ? ? ?  ? ?History   ?Chief Complaint ?Chief Complaint  ?Patient presents with  ? Sore Throat  ?  Body aches, sore throat and headache  ? ? ?HPI ?Jonathan Mckay is a 26 y.o. male.  ? ?Presenting today with over a week of cough, sore throat, headache, body aches, facial pain and pressure, nasal congestion.  Denies chest pain, shortness of breath, abdominal pain, nausea vomiting or diarrhea.  Symptoms progressively worsening over time.  History of sinus infections.  Trying DayQuil, NyQuil, pain relievers with minimal relief. ? ? ?Past Medical History:  ?Diagnosis Date  ? Eosinophilic esophagitis   ? GERD (gastroesophageal reflux disease)   ? History of Clostridium difficile   ? 2012  ? Nasal congestion   ? Non-productive cough   ? Right knee meniscal tear   ? Sinusitis, acute   ? ? ?Patient Active Problem List  ? Diagnosis Date Noted  ? S/P right knee arthroscopy 05/26/2013  ? Eosinophilic esophagitis 09/08/2012  ? Difficulty swallowing 09/05/2012  ? Abdominal pain   ? C. difficile diarrhea   ? Blood in stool   ? ? ?Past Surgical History:  ?Procedure Laterality Date  ? CLEFT PALATE REPAIR  age 89 mon old  &  89 mon old  ? and cleft lip repair  ? DIRECT LARYNGOSCOPY  08-21-2006  ? W/  ESOPHAGOSCOPY AND BRONCHOSCOPY AND REMOVAL FORGEIN BODY  ? ESOPHAGOGASTRODUODENOSCOPY N/A 06/20/2014  ? Procedure: ESOPHAGOGASTRODUODENOSCOPY (EGD);  Surgeon: Malissa HippoNajeeb U Rehman, MD;  [facility];  Service: Endoscopy;  Laterality: N/A;  200  ? ESOPHAGOSCOPY N/A 09/05/2012  ? Procedure: ESOPHAGOSCOPY Foreign Body Removal;  Surgeon: Jon GillsJoseph H Clark, MD;  [facility];  Service: Gastroenterology;  Laterality: N/A;  ? jaw Right   ? plate to right lower jaw due to fx  ? KNEE ARTHROSCOPY Right 05/26/2013  ? Procedure: ARTHROSCOPY RIGHT KNEE WITH DEBRIDEMENT  PARTIAL MENISECTOMY REMOVAL OF FOREIGN BODY ;  Surgeon: Eugenia Mcalpineobert Collins, MD;  [facility];  Service: Orthopedics;  Laterality: Right;  ? ? ? ? ? ?Home Medications   ? ?Prior to Admission medications   ?Medication Sig Start Date End Date Taking? Authorizing Provider  ?azithromycin (ZITHROMAX) 250 MG tablet Take first 2 tablets together, then 1 every day until finished. 06/02/21  Yes Particia NearingLane, Naim Murtha Elizabeth, PA-C  ?esomeprazole (NEXIUM) 20 MG capsule Take 20 mg by mouth daily at 12 noon.    [provider]  ?fluticasone (FLONASE) 50 MCG/ACT nasal spray Place 2 sprays into both nostrils daily. 07/14/20   Domenick GongMortenson, Ashley, MD  ?ibuprofen (ADVIL) 600 MG tablet Take 1 tablet (600 mg total) by mouth every 6 (six) hours as needed. 07/14/20   Domenick GongMortenson, Ashley, MD  ?ondansetron (ZOFRAN ODT) 8 MG disintegrating tablet 1/2- 1 tablet q 8 hr prn nausea, vomiting 07/14/20   Domenick GongMortenson, Ashley, MD  ?oseltamivir (TAMIFLU) 75 MG capsule Take 1 capsule (75 mg total) by mouth 2 (two) times daily. X 5 days 07/14/20   Domenick GongMortenson, Ashley, MD  ?predniSONE (DELTASONE) 10 MG tablet Take 6 tabs daily x 2 days, 5 tabs daily x 2 days, 4 tabs daily x 2 days, etc 11/13/20   Particia NearingLane, Raejean Swinford Elizabeth, PA-C  ?triamcinolone cream (KENALOG) 0.1 % Apply 1 application topically 2 (two) times daily. 11/13/20   Particia NearingLane, Justene Jensen Elizabeth, PA-C  ? ? ?Family History ?Family History  ?Problem Relation  Age of Onset  ? Stroke Other   ? Asthma Other   ? Cancer Other   ? Inflammatory bowel disease Neg Hx   ? ? ?Social History ?Social History  ? ?Tobacco Use  ? Smoking status: Some Days  ?  Types: Cigarettes  ?  Passive exposure: Never  ? Smokeless tobacco: Never  ? Tobacco comments:  ?  Pt states that he smokes ciggs and vapes some days  ?Vaping Use  ? Vaping Use: Some days  ?Substance Use Topics  ? Alcohol use: Yes  ?  Comment: occasionally  ? Drug use: No  ? ? ? ?Allergies   ?Amoxicillin, Penicillins, Shellfish allergy, Clindamycin/lincomycin, Vancomycin, and Benadryl [diphenhydramine hcl] ? ? ?Review of Systems ?Review of Systems ?Per  HPI ? ?Physical Exam ?Triage Vital Signs ?ED Triage Vitals  ?Enc Vitals Group  ?   BP 06/02/21 1501 124/71  ?   Pulse Rate 06/02/21 1501 (!) 59  ?   Resp 06/02/21 1501 20  ?   Temp 06/02/21 1501 98.1 ?F (36.7 ?C)  ?   Temp Source 06/02/21 1501 Oral  ?   SpO2 06/02/21 1501 95 %  ?   Weight --   ?   Height --   ?   Head Circumference --   ?   Peak Flow --   ?   Pain Score 06/02/21 1458 6  ?   Pain Loc --   ?   Pain Edu? --   ?   Excl. in GC? --   ? ?No data found. ? ?Updated Vital Signs ?BP 124/71 (BP Location: Right Arm)   Pulse (!) 59   Temp 98.1 ?F (36.7 ?C) (Oral)   Resp 20   SpO2 95%  ? ?Visual Acuity ?Right Eye Distance:   ?Left Eye Distance:   ?Bilateral Distance:   ? ?Right Eye Near:   ?Left Eye Near:    ?Bilateral Near:    ? ?Physical Exam ?Vitals and nursing note reviewed.  ?Constitutional:   ?   Appearance: He is well-developed.  ?HENT:  ?   Head: Atraumatic.  ?   Right Ear: External ear normal.  ?   Left Ear: External ear normal.  ?   Nose: Congestion present.  ?   Mouth/Throat:  ?   Mouth: Mucous membranes are moist.  ?   Pharynx: Posterior oropharyngeal erythema present. No oropharyngeal exudate.  ?Eyes:  ?   Conjunctiva/sclera: Conjunctivae normal.  ?   Pupils: Pupils are equal, round, and reactive to light.  ?Cardiovascular:  ?   Rate and Rhythm: Normal rate and regular rhythm.  ?Pulmonary:  ?   Effort: Pulmonary effort is normal. No respiratory distress.  ?   Breath sounds: No wheezing or rales.  ?Musculoskeletal:     ?   General: Normal range of motion.  ?   Cervical back: Normal range of motion and neck supple.  ?Lymphadenopathy:  ?   Cervical: No cervical adenopathy.  ?Skin: ?   General: Skin is warm and dry.  ?Neurological:  ?   Mental Status: He is alert and oriented to person, place, and time.  ?Psychiatric:     ?   Behavior: Behavior normal.  ? ?UC Treatments / Results  ?Labs ?(all labs ordered are listed, but only abnormal results are displayed) ?Labs Reviewed  ?COVID-19, FLU A+B NAA   ? ? ?EKG ? ? ?Radiology ?No results found. ? ?Procedures ?Procedures (including critical care time) ? ?Medications Ordered in UC ?  Medications - No data to display ? ?Initial Impression / Assessment and Plan / UC Course  ?I have reviewed the triage vital signs and the nursing notes. ? ?Pertinent labs & imaging results that were available during my care of the patient were reviewed by me and considered in my medical decision making (see chart for details). ? ?  ? ?Treat with azithromycin, Mucinex, sinus rinses, allergy regimen.  Return for acutely worsening symptoms. ? ?Final Clinical Impressions(s) / UC Diagnoses  ? ?Final diagnoses:  ?Exposure to the flu  ?Acute maxillary sinusitis, recurrence not specified  ? ?Discharge Instructions   ?None ?  ? ?ED Prescriptions   ? ? Medication Sig Dispense Auth. Provider  ? azithromycin (ZITHROMAX) 250 MG tablet Take first 2 tablets together, then 1 every day until finished. 6 tablet Particia Nearing, [place]  ? ?  ? ?PDMP not reviewed this encounter. ?  ?Particia Nearing, PA-C ?06/02/21 1621 ? ?

## 2021-06-02 NOTE — ED Triage Notes (Signed)
Pt states about a week ago he started having a cough, sore throat and headache with lower back pain  ? ?Pt states he tried Tylenol and Dayquil without relief ? ? ? ?Denies Fever ?

## 2021-06-03 LAB — COVID-19, FLU A+B NAA
Influenza A, NAA: NOT DETECTED
Influenza B, NAA: NOT DETECTED
SARS-CoV-2, NAA: NOT DETECTED

## 2022-01-19 ENCOUNTER — Ambulatory Visit
Admission: RE | Admit: 2022-01-19 | Discharge: 2022-01-19 | Disposition: A | Discharge status: Home / Self Care | Payer: 59 | Source: Ambulatory Visit | Attending: Nurse Practitioner | Admitting: Nurse Practitioner

## 2022-01-19 VITALS — BP 112/82 | HR 74 | Temp 98.5°F | Resp 17

## 2022-01-19 DIAGNOSIS — Z1152 Encounter for screening for COVID-19: Secondary | ICD-10-CM | POA: Insufficient documentation

## 2022-01-19 DIAGNOSIS — J069 Acute upper respiratory infection, unspecified: Secondary | ICD-10-CM | POA: Diagnosis not present

## 2022-01-19 LAB — RESP PANEL BY RT-PCR (FLU A&B, COVID) ARPGX2
Influenza A by PCR: NEGATIVE
Influenza B by PCR: NEGATIVE
SARS Coronavirus 2 by RT PCR: POSITIVE — AB

## 2022-01-19 MED ORDER — BENZONATATE 100 MG PO CAPS: 100.0000 mg | ORAL_CAPSULE | Freq: Three times a day (TID) | ORAL | 0 refills | Status: DC | PRN

## 2022-01-19 NOTE — ED Triage Notes (Signed)
Pt presents with complaints of cough, congestion, headache, bodyaches, sore throat, and night sweats x 3 days.

## 2022-01-19 NOTE — ED Provider Notes (Signed)
[facility]    CSN: [id number] Arrival date & time: 01/19/22  1249      History   Chief Complaint Chief Complaint  Patient presents with   Appointment   Nasal Congestion   Cough    HPI Jonathan Mckay is a 26 y.o. male.   Patient presents today for 3 days of body aches, chills, night sweats, slight cough, nasal congestion and runny nose, postnasal drainage, sneezing and sore throat, headache, bilateral ear pain, decreased appetite, and fatigue.  Also endorses nausea from all of the postnasal drainage.  Patient denies shortness of breath or chest pain, chest congestion, abdominal pain, vomiting, diarrhea, and loss of taste or smell.  Reports 2 coworkers recently had similar symptoms.  Has not take anything for symptoms so far.     Past Medical History:  Diagnosis Date   Eosinophilic esophagitis    GERD (gastroesophageal reflux disease)    History of Clostridium difficile    2012   Nasal congestion    Non-productive cough    Right knee meniscal tear    Sinusitis, acute     Patient Active Problem List   Diagnosis Date Noted   S/P right knee arthroscopy 05/26/2013   Eosinophilic esophagitis 09/08/2012   Difficulty swallowing 09/05/2012   Abdominal pain    C. difficile diarrhea    Blood in stool     Past Surgical History:  Procedure Laterality Date   CLEFT PALATE REPAIR  age 8 mon old  &  23 mon old   and cleft lip repair   DIRECT LARYNGOSCOPY  08-21-2006   W/  ESOPHAGOSCOPY AND BRONCHOSCOPY AND REMOVAL FORGEIN BODY   ESOPHAGOGASTRODUODENOSCOPY N/A 06/20/2014   Procedure: ESOPHAGOGASTRODUODENOSCOPY (EGD);  Surgeon: Malissa Hippo, MD;  [facility]: [facility];  Service: Endoscopy;  Laterality: N/A;  200   ESOPHAGOSCOPY N/A 09/05/2012   Procedure: ESOPHAGOSCOPY Foreign Body Removal;  Surgeon: Jon Gills, MD;  Location: [facility] OR;  Service: Gastroenterology;  Laterality: N/A;   jaw Right    plate to right lower jaw due to fx   KNEE ARTHROSCOPY Right  05/26/2013   Procedure: ARTHROSCOPY RIGHT KNEE WITH DEBRIDEMENT  PARTIAL MENISECTOMY REMOVAL OF FOREIGN BODY ;  Surgeon: Eugenia Mcalpine, MD;  Location: [facility] [place];  Service: Orthopedics;  Laterality: Right;       Home Medications    Prior to Admission medications   Medication Sig Start Date End Date Taking? Authorizing Provider  benzonatate (TESSALON) 100 MG capsule Take 1 capsule (100 mg total) by mouth 3 (three) times daily as needed for cough. Do not take with alcohol or while driving or operating heavy machinery.  May cause drowsiness. 01/19/22  Yes Valentino Nose, NP  esomeprazole (NEXIUM) 20 MG capsule Take 20 mg by mouth daily at 12 noon.    [provider]    Family History Family History  Problem Relation Age of Onset   Stroke Other    Asthma Other    Cancer Other    Inflammatory bowel disease Neg Hx     Social History Social History   Tobacco Use   Smoking status: Some Days    Types: Cigarettes    Passive exposure: Never   Smokeless tobacco: Never   Tobacco comments:    Pt states that he smokes ciggs and vapes some days  Vaping Use   Vaping Use: Some days  Substance Use Topics   Alcohol use: Yes    Comment: occasionally  Drug use: No     Allergies   Amoxicillin, Penicillins, Shellfish allergy, Clindamycin/lincomycin, Vancomycin, and Benadryl [diphenhydramine hcl]   Review of Systems Review of Systems Per HPI  Physical Exam Triage Vital Signs ED Triage Vitals  Enc Vitals Group     BP 01/19/22 1325 112/82     Pulse Rate 01/19/22 1325 74     Resp 01/19/22 1325 17     Temp 01/19/22 1325 98.5 F (36.9 C)     Temp src --      SpO2 01/19/22 1325 95 %     Weight --      Height --      Head Circumference --      Peak Flow --      Pain Score 01/19/22 1324 2     Pain Loc --      Pain Edu? --      Excl. in GC? --    No data found.  Updated Vital Signs BP 112/82   Pulse 74   Temp 98.5 F (36.9 C)   Resp 17    SpO2 95%   Visual Acuity Right Eye Distance:   Left Eye Distance:   Bilateral Distance:    Right Eye Near:   Left Eye Near:    Bilateral Near:     Physical Exam Vitals and nursing note reviewed.  Constitutional:      General: He is not in acute distress.    Appearance: Normal appearance. He is not ill-appearing or toxic-appearing.  HENT:     Head: Normocephalic and atraumatic.     Right Ear: Tympanic membrane, ear canal and external ear normal.     Left Ear: Tympanic membrane, ear canal and external ear normal.     Nose: Congestion and rhinorrhea present.     Mouth/Throat:     Mouth: Mucous membranes are moist.     Pharynx: Oropharynx is clear. Posterior oropharyngeal erythema present. No oropharyngeal exudate.  Eyes:     General: No scleral icterus.    Extraocular Movements: Extraocular movements intact.  Cardiovascular:     Rate and Rhythm: Normal rate and regular rhythm.  Pulmonary:     Effort: Pulmonary effort is normal. No respiratory distress.     Breath sounds: Normal breath sounds. No wheezing, rhonchi or rales.  Abdominal:     General: Abdomen is flat. Bowel sounds are normal. There is no distension.     Palpations: Abdomen is soft.     Tenderness: There is no abdominal tenderness.  Musculoskeletal:     Cervical back: Normal range of motion and neck supple.  Lymphadenopathy:     Cervical: No cervical adenopathy.  Skin:    General: Skin is warm and dry.     Coloration: Skin is not jaundiced or pale.     Findings: No erythema or rash.  Neurological:     Mental Status: He is alert and oriented to person, place, and time.  Psychiatric:        Behavior: Behavior is cooperative.      UC Treatments / Results  Labs (all labs ordered are listed, but only abnormal results are displayed) Labs Reviewed  RESP PANEL BY RT-PCR (FLU A&B, COVID) [id number]    EKG   Radiology No results found.  Procedures Procedures (including critical care time)  Medications  Ordered in UC Medications - No data to display  Initial Impression / Assessment and Plan / UC Course  I have reviewed the triage vital signs and  the nursing notes.  Pertinent labs & imaging results that were available during my care of the patient were reviewed by me and considered in my medical decision making (see chart for details).   Patient is well-appearing, normotensive, afebrile, not tachycardic, not tachypneic, oxygenating well on room air.    Encounter for screening for COVID-19 Viral URI with cough Suspect viral etiology COVID-19, influenza testing obtained Isolation recommended until day 5 of symptoms or negative result returns Supportive care discussed with patient including start Mucinex, nasal saline rinses, steam showers Cough suppressant prescribed for as needed use for dry cough ER and return precautions discussed Note given for work  The patient was given the opportunity to ask questions.  All questions answered to their satisfaction.  The patient is in agreement to this plan.    Final Clinical Impressions(s) / UC Diagnoses   Final diagnoses:  Encounter for screening for COVID-19  Viral URI with cough     Discharge Instructions      You have a viral upper respiratory infection.  Symptoms should improve over the next week to 10 days.  If you develop chest pain or shortness of breath, go to the emergency room.  We have tested you today for COVID-19 and influenza.  You will see the results in Mychart and we will call you with positive results.    Please stay home and isolate until you are aware of the results.    Some things that can make you feel better are: - Increased rest - Increasing fluid with water/sugar free electrolytes - Acetaminophen and ibuprofen as needed for fever/pain - Salt water gargling, chloraseptic spray and throat lozenges - OTC guaifenesin (Mucinex) 600 mg twice daily - Saline sinus flushes or a neti pot - Humidifying the air -Tessalon  Perles as needed for dry cough      ED Prescriptions     Medication Sig Dispense Auth. Provider   benzonatate (TESSALON) 100 MG capsule Take 1 capsule (100 mg total) by mouth 3 (three) times daily as needed for cough. Do not take with alcohol or while driving or operating heavy machinery.  May cause drowsiness. 21 capsule Valentino Nose, NP      PDMP not reviewed this encounter.   Valentino Nose, NP 01/19/22 1353

## 2022-01-19 NOTE — Discharge Instructions (Addendum)
You have a viral upper respiratory infection.  Symptoms should improve over the next week to 10 days.  If you develop chest pain or shortness of breath, go to the emergency room.  We have tested you today for COVID-19 and influenza.  You will see the results in Mychart and we will call you with positive results.    Please stay home and isolate until you are aware of the results.    Some things that can make you feel better are: - Increased rest - Increasing fluid with water/sugar free electrolytes - Acetaminophen and ibuprofen as needed for fever/pain - Salt water gargling, chloraseptic spray and throat lozenges - OTC guaifenesin (Mucinex) 600 mg twice daily - Saline sinus flushes or a neti pot - Humidifying the air -Tessalon Perles as needed for dry cough 

## 2022-03-13 ENCOUNTER — Ambulatory Visit
Admission: EM | Admit: 2022-03-13 | Discharge: 2022-03-13 | Disposition: A | Discharge status: Home / Self Care | Payer: 59

## 2022-03-13 ENCOUNTER — Encounter: Payer: Self-pay | Admitting: Emergency Medicine

## 2022-03-13 DIAGNOSIS — K5900 Constipation, unspecified: Secondary | ICD-10-CM

## 2022-03-13 DIAGNOSIS — R1030 Lower abdominal pain, unspecified: Secondary | ICD-10-CM

## 2022-03-13 NOTE — ED Triage Notes (Signed)
Abd pain that started to day.  Last BM 3 days ago.

## 2022-03-13 NOTE — Discharge Instructions (Signed)
I recommend taking multiple capfuls of MiraLAX several times throughout the day in addition to Colace twice daily.  You may also start a fiber supplement and probiotic daily ongoing to help with maintaining good bowel habits.  Make sure to be eating a high-fiber diet and drinking lots and lots of water.  If not providing any relief over the next day or so you may need to try an enema at home.  Follow-up if your symptoms significantly worsen at any time

## 2022-03-13 NOTE — ED Provider Notes (Signed)
[facility]    CSN: [id number] Arrival date & time: 03/13/22  1804      History   Chief Complaint No chief complaint on file.   HPI Jonathan Mckay is a 27 y.o. male.   Patient presenting today with diffuse lower abdominal cramping, fullness, pressure that started this morning.  States his last bowel movement was 3 days ago and believes his pain is coming from constipation.  He denies any upper abdominal pain, vomiting, diarrhea, fever, chills, new foods or medications.  Has struggled with constipation off and on throughout the majority of his adult life and has a colonoscopy coming up in the next few weeks per patient.  So far has not tried anything over-the-counter for symptoms and states he thinks his diet is a large contributor to the issue.    Past Medical History:  Diagnosis Date   Eosinophilic esophagitis    GERD (gastroesophageal reflux disease)    History of Clostridium difficile    2012   Nasal congestion    Non-productive cough    Right knee meniscal tear    Sinusitis, acute     Patient Active Problem List   Diagnosis Date Noted   S/P right knee arthroscopy [id number]   Eosinophilic esophagitis Q000111Q   Difficulty swallowing 09/05/2012   Abdominal pain    C. difficile diarrhea    Blood in stool     Past Surgical History:  Procedure Laterality Date   CLEFT PALATE REPAIR  age 72 mon old  &  29 mon old   and cleft lip repair   DIRECT LARYNGOSCOPY  08-21-2006   W/  ESOPHAGOSCOPY AND BRONCHOSCOPY AND REMOVAL FORGEIN BODY   ESOPHAGOGASTRODUODENOSCOPY N/A 06/20/2014   Procedure: ESOPHAGOGASTRODUODENOSCOPY (EGD);  Surgeon: Rogene Houston, MD;  [facility];  Service: Endoscopy;  Laterality: N/A;  200   ESOPHAGOSCOPY N/A 09/05/2012   Procedure: ESOPHAGOSCOPY Foreign Body Removal;  Surgeon: Oletha Blend, MD;  Location: [place];  Service: Gastroenterology;  Laterality: N/A;   jaw Right    plate to right lower jaw due to fx   KNEE ARTHROSCOPY  Right 05/26/2013   Procedure: ARTHROSCOPY RIGHT KNEE WITH DEBRIDEMENT  PARTIAL MENISECTOMY REMOVAL OF FOREIGN BODY ;  Surgeon: Sydnee Cabal, MD;  Location: [place];  Service: Orthopedics;  Laterality: Right;       Home Medications    Prior to Admission medications   Medication Sig Start Date End Date Taking? Authorizing Provider  benzonatate (TESSALON) 100 MG capsule Take 1 capsule (100 mg total) by mouth 3 (three) times daily as needed for cough. Do not take with alcohol or while driving or operating heavy machinery.  May cause drowsiness. 01/19/22   Eulogio Bear, NP  esomeprazole (NEXIUM) 20 MG capsule Take 20 mg by mouth daily at 12 noon.    [provider]    Family History Family History  Problem Relation Age of Onset   Stroke Other    Asthma Other    Cancer Other    Inflammatory bowel disease Neg Hx     Social History Social History   Tobacco Use   Smoking status: Some Days    Types: Cigarettes    Passive exposure: Never   Smokeless tobacco: Never   Tobacco comments:    Pt states that he smokes ciggs and vapes some days  Vaping Use   Vaping Use: Some days  Substance Use Topics   Alcohol use: Yes  Comment: occasionally   Drug use: No     Allergies   Amoxicillin, Penicillins, Shellfish allergy, Clindamycin/lincomycin, Vancomycin, and Benadryl [diphenhydramine hcl]   Review of Systems Review of Systems Per HPI  Physical Exam Triage Vital Signs ED Triage Vitals  Enc Vitals Group     BP 03/13/22 1915 130/86     Pulse Rate 03/13/22 1915 75     Resp 03/13/22 1915 18     Temp 03/13/22 1915 98 F (36.7 C)     Temp Source 03/13/22 1915 Oral     SpO2 03/13/22 1915 96 %     Weight --      Height --      Head Circumference --      Peak Flow --      Pain Score 03/13/22 1917 7     Pain Loc --      Pain Edu? --      Excl. in [place]? --    No data found.  Updated Vital Signs BP 130/86 (BP Location: Right Arm)   Pulse 75    Temp 98 F (36.7 C) (Oral)   Resp 18   SpO2 96%   Visual Acuity Right Eye Distance:   Left Eye Distance:   Bilateral Distance:    Right Eye Near:   Left Eye Near:    Bilateral Near:     Physical Exam Vitals and nursing note reviewed.  Constitutional:      Appearance: Normal appearance.  HENT:     Head: Atraumatic.     Mouth/Throat:     Mouth: Mucous membranes are moist.  Eyes:     Extraocular Movements: Extraocular movements intact.     Conjunctiva/sclera: Conjunctivae normal.  Cardiovascular:     Rate and Rhythm: Normal rate and regular rhythm.  Pulmonary:     Effort: Pulmonary effort is normal.     Breath sounds: Normal breath sounds.  Abdominal:     General: Bowel sounds are normal. There is no distension.     Palpations: Abdomen is soft.     Tenderness: There is abdominal tenderness. There is no right CVA tenderness, left CVA tenderness or guarding.     Comments: Diffuse mild lower abdominal tenderness to palpation without distention or guarding  Musculoskeletal:        General: Normal range of motion.     Cervical back: Normal range of motion and neck supple.  Skin:    General: Skin is warm and dry.  Neurological:     General: No focal deficit present.     Mental Status: He is oriented to person, place, and time.  Psychiatric:        Mood and Affect: Mood normal.        Thought Content: Thought content normal.        Judgment: Judgment normal.    UC Treatments / Results  Labs (all labs ordered are listed, but only abnormal results are displayed) Labs Reviewed - No data to display  EKG  Radiology No results found.  Procedures Procedures (including critical care time)  Medications Ordered in UC Medications - No data to display  Initial Impression / Assessment and Plan / UC Course  I have reviewed the triage vital signs and the nursing notes.  Pertinent labs & imaging results that were available during my care of the patient were reviewed by me  and considered in my medical decision making (see chart for details).     Vitals and exam overall very  reassuring with no red flag findings.  Symptoms and exam findings consistent with constipation.  Treat with MiraLAX, Colace, fluids, high-fiber diet and enema if needed.  Discussed fiber supplement, probiotics ongoing to help maintain healthy bowel movements.  Follow-up with GI as scheduled, return sooner for worsening symptoms.  Final Clinical Impressions(s) / UC Diagnoses   Final diagnoses:  Lower abdominal pain  Constipation, unspecified constipation type     Discharge Instructions      I recommend taking multiple capfuls of MiraLAX several times throughout the day in addition to Colace twice daily.  You may also start a fiber supplement and probiotic daily ongoing to help with maintaining good bowel habits.  Make sure to be eating a high-fiber diet and drinking lots and lots of water.  If not providing any relief over the next day or so you may need to try an enema at home.  Follow-up if your symptoms significantly worsen at any time    ED Prescriptions   None    PDMP not reviewed this encounter.   Volney American, [place] 03/13/22 2006

## 2022-03-31 NOTE — Progress Notes (Deleted)
GI Office Note    Referring Provider: Neale Burly, MD Primary Care Physician:  Neale Burly, MD  Primary Gastroenterologist:  Chief Complaint   No chief complaint on file.    History of Present Illness   Jonathan Mckay is a 27 y.o. male presenting today at the request of Dr. Sherrie Sport for further evaluation of blood in the stool. He has history of EOE. Previously seen by pediatric GI, Dr. Rodman Pickle.           Medications   Current Outpatient Medications  Medication Sig Dispense Refill   benzonatate (TESSALON) 100 MG capsule Take 1 capsule (100 mg total) by mouth 3 (three) times daily as needed for cough. Do not take with alcohol or while driving or operating heavy machinery.  May cause drowsiness. 21 capsule 0   esomeprazole (NEXIUM) 20 MG capsule Take 20 mg by mouth daily at 12 noon.     No current facility-administered medications for this visit.    Allergies   Allergies as of 04/01/2022 - Review Complete 03/13/2022  Allergen Reaction Noted   Amoxicillin Anaphylaxis 01/03/2011   Penicillins Anaphylaxis 09/24/2016   Shellfish allergy Anaphylaxis 06/30/2010   Clindamycin/lincomycin  09/23/2013   Vancomycin Hives 07/12/2016   Benadryl [diphenhydramine hcl] Hives and Swelling 06/30/2010    Past Medical History   Past Medical History:  Diagnosis Date   Eosinophilic esophagitis    GERD (gastroesophageal reflux disease)    History of Clostridium difficile    2012   Nasal congestion    Non-productive cough    Right knee meniscal tear    Sinusitis, acute     Past Surgical History   Past Surgical History:  Procedure Laterality Date   CLEFT PALATE REPAIR  age 42 mon old  &  26 mon old   and cleft lip repair   DIRECT LARYNGOSCOPY  08-21-2006   W/  ESOPHAGOSCOPY AND BRONCHOSCOPY AND REMOVAL FORGEIN BODY   ESOPHAGOGASTRODUODENOSCOPY N/A 06/20/2014   Procedure: ESOPHAGOGASTRODUODENOSCOPY (EGD);  Surgeon: Rogene Houston, MD;  [facility];   Service: Endoscopy;  Laterality: N/A;  200   ESOPHAGOSCOPY N/A 09/05/2012   Procedure: ESOPHAGOSCOPY Foreign Body Removal;  Surgeon: Oletha Blend, MD;  Location: [place];  Service: Gastroenterology;  Laterality: N/A;   jaw Right    plate to right lower jaw due to fx   KNEE ARTHROSCOPY Right 05/26/2013   Procedure: ARTHROSCOPY RIGHT KNEE WITH DEBRIDEMENT  PARTIAL MENISECTOMY REMOVAL OF FOREIGN BODY ;  Surgeon: Sydnee Cabal, MD;  Location: [place];  Service: Orthopedics;  Laterality: Right;    Past Family History   Family History  Problem Relation Age of Onset   Stroke Other    Asthma Other    Cancer Other    Inflammatory bowel disease Neg Hx     Past Social History   Social History   Socioeconomic History   Marital status: Single    Spouse name: Not on file   Number of children: Not on file   Years of education: Not on file   Highest education level: Not on file  Occupational History   Not on file  Tobacco Use   Smoking status: Some Days    Types: Cigarettes    Passive exposure: Never   Smokeless tobacco: Never   Tobacco comments:    Pt states that he smokes ciggs and vapes some days  Vaping Use   Vaping Use: Some days  Substance  and Sexual Activity   Alcohol use: Yes    Comment: occasionally   Drug use: No   Sexual activity: Not Currently    Birth control/protection: None  Other Topics Concern   Not on file  Social History Narrative    NO ANESTHESIA ISSUES WITH PT      PT HAS ORTHODONTIC BRACES ON   Social Determinants of Health   Financial Resource Strain: Not on file  Food Insecurity: Not on file  Transportation Needs: Not on file  Physical Activity: Not on file  Stress: Not on file  Social Connections: Not on file  Intimate Partner Violence: Not on file    Review of Systems   General: Negative for anorexia, weight loss, fever, chills, fatigue, weakness. Eyes: Negative for vision changes.  ENT: Negative for hoarseness,  difficulty swallowing , nasal congestion. CV: Negative for chest pain, angina, palpitations, dyspnea on exertion, peripheral edema.  Respiratory: Negative for dyspnea at rest, dyspnea on exertion, cough, sputum, wheezing.  GI: See history of present illness. GU:  Negative for dysuria, hematuria, urinary incontinence, urinary frequency, nocturnal urination.  MS: Negative for joint pain, low back pain.  Derm: Negative for rash or itching.  Neuro: Negative for weakness, abnormal sensation, seizure, frequent headaches, memory loss,  confusion.  Psych: Negative for anxiety, depression, suicidal ideation, hallucinations.  Endo: Negative for unusual weight change.  Heme: Negative for bruising or bleeding. Allergy: Negative for rash or hives.  Physical Exam   There were no vitals taken for this visit.   General: Well-nourished, well-developed in no acute distress.  Head: Normocephalic, atraumatic.   Eyes: Conjunctiva pink, no icterus. Mouth: Oropharyngeal mucosa moist and pink , no lesions erythema or exudate. Neck: Supple without thyromegaly, masses, or lymphadenopathy.  Lungs: Clear to auscultation bilaterally.  Heart: Regular rate and rhythm, no murmurs rubs or gallops.  Abdomen: Bowel sounds are normal, nontender, nondistended, no hepatosplenomegaly or masses,  no abdominal bruits or hernia, no rebound or guarding.   Rectal: *** Extremities: No lower extremity edema. No clubbing or deformities.  Neuro: Alert and oriented x 4 , grossly normal neurologically.  Skin: Warm and dry, no rash or jaundice.   Psych: Alert and cooperative, normal mood and affect.  Labs   *** Imaging Studies   No results found.  Assessment       PLAN   ***   Laureen Ochs. Bobby Rumpf, [place], [facility]

## 2022-04-01 ENCOUNTER — Ambulatory Visit: Payer: 59 | Admitting: Gastroenterology

## 2022-04-01 ENCOUNTER — Encounter: Payer: Self-pay | Admitting: Gastroenterology

## 2022-07-10 ENCOUNTER — Ambulatory Visit
Admission: EM | Admit: 2022-07-10 | Discharge: 2022-07-10 | Disposition: A | Discharge status: Home / Self Care | Payer: PRIVATE HEALTH INSURANCE

## 2022-07-10 DIAGNOSIS — R21 Rash and other nonspecific skin eruption: Secondary | ICD-10-CM

## 2022-07-10 MED ORDER — TRIAMCINOLONE ACETONIDE 0.1 % EX CREA: 1.0000 | TOPICAL_CREAM | Freq: Two times a day (BID) | CUTANEOUS | 0 refills | Status: DC

## 2022-07-10 NOTE — Discharge Instructions (Addendum)
Take medication as prescribed. May also take over-the-counter Zyrtec daily to help with itching. Avoid hot baths or showers while symptoms persist.  Recommend taking lukewarm baths. May apply cool cloths to the area to help with itching or discomfort. Avoid scratching, rubbing, or manipulating the areas while symptoms persist. Recommend Aveeno colloidal oatmeal bath to use to help with drying and itching. Follow up if symptoms do not improve.

## 2022-07-10 NOTE — ED Triage Notes (Signed)
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

## 2022-07-10 NOTE — ED Provider Notes (Signed)
[facility]    CSN: [id number] Arrival date & time: 07/10/22  1644      History   Chief Complaint No chief complaint on file.   HPI Jonathan Mckay is a 27 y.o. male.   The history is provided by the patient.   The patient presents for a possible insect bite to the his right side.  Symptoms are present for the past 24 hours after he was working outside.  He states the area to his right side is red and itchy.  He denies fever, chills, chest pain, Donnell pain, nausea, vomiting, or diarrhea.  Patient does not know what possibly may have bitten him.  He has not used any medication for his symptoms.  Past Medical History:  Diagnosis Date   Eosinophilic esophagitis    GERD (gastroesophageal reflux disease)    History of Clostridium difficile    2012   Nasal congestion    Non-productive cough    Right knee meniscal tear    Sinusitis, acute     Patient Active Problem List   Diagnosis Date Noted   S/P right knee arthroscopy 05/26/2013   Eosinophilic esophagitis 09/08/2012   Difficulty swallowing 09/05/2012   Abdominal pain    C. difficile diarrhea    Blood in stool     Past Surgical History:  Procedure Laterality Date   CLEFT PALATE REPAIR  age 76 mon old  &  89 mon old   and cleft lip repair   DIRECT LARYNGOSCOPY  08-21-2006   W/  ESOPHAGOSCOPY AND BRONCHOSCOPY AND REMOVAL FORGEIN BODY   ESOPHAGOGASTRODUODENOSCOPY N/A 06/20/2014   Procedure: ESOPHAGOGASTRODUODENOSCOPY (EGD);  Surgeon: Malissa Hippo, MD;  [facility];  Service: Endoscopy;  Laterality: N/A;  200   ESOPHAGOSCOPY N/A 09/05/2012   Procedure: ESOPHAGOSCOPY Foreign Body Removal;  Surgeon: Jon Gills, MD;  Location: [facility];  Service: Gastroenterology;  Laterality: N/A;   jaw Right    plate to right lower jaw due to fx   KNEE ARTHROSCOPY Right 05/26/2013   Procedure: ARTHROSCOPY RIGHT KNEE WITH DEBRIDEMENT  PARTIAL MENISECTOMY REMOVAL OF FOREIGN BODY ;  Surgeon: Eugenia Mcalpine, MD;   Location: [facility] ;  Service: Orthopedics;  Laterality: Right;       Home Medications    Prior to Admission medications   Medication Sig Start Date End Date Taking? Authorizing Provider  loratadine (CLARITIN) 10 MG tablet Take 10 mg by mouth daily.   Yes [provider]  triamcinolone cream (KENALOG) 0.1 % Apply 1 Application topically 2 (two) times daily. 07/10/22  Yes Izel Hochberg-Warren, Sadie Haber, NP  benzonatate (TESSALON) 100 MG capsule Take 1 capsule (100 mg total) by mouth 3 (three) times daily as needed for cough. Do not take with alcohol or while driving or operating heavy machinery.  May cause drowsiness. 01/19/22   Valentino Nose, NP  esomeprazole (NEXIUM) 20 MG capsule Take 20 mg by mouth daily at 12 noon.    [provider]    Family History Family History  Problem Relation Age of Onset   Stroke Other    Asthma Other    Cancer Other    Inflammatory bowel disease Neg Hx     Social History Social History   Tobacco Use   Smoking status: Some Days    Types: Cigarettes    Passive exposure: Never   Smokeless tobacco: Never   Tobacco comments:    Pt states that he smokes ciggs and vapes some  days  Vaping Use   Vaping Use: Some days  Substance Use Topics   Alcohol use: Yes    Comment: occasionally   Drug use: No     Allergies   Amoxicillin, Penicillins, Shellfish allergy, Clindamycin/lincomycin, Vancomycin, and Benadryl [diphenhydramine hcl]   Review of Systems Review of Systems Per HPI  Physical Exam Triage Vital Signs ED Triage Vitals  Enc Vitals Group     BP 07/10/22 1651 119/75     Pulse Rate 07/10/22 1651 65     Resp 07/10/22 1651 20     Temp 07/10/22 1651 98.1 F (36.7 C)     Temp Source 07/10/22 1651 Oral     SpO2 07/10/22 1651 99 %     Weight --      Height --      Head Circumference --      Peak Flow --      Pain Score 07/10/22 1654 4     Pain Loc --      Pain Edu? --      Excl. in [place]? --    No  data found.  Updated Vital Signs BP 119/75 (BP Location: Right Arm)   Pulse 65   Temp 98.1 F (36.7 C) (Oral)   Resp 20   SpO2 99%   Visual Acuity Right Eye Distance:   Left Eye Distance:   Bilateral Distance:    Right Eye Near:   Left Eye Near:    Bilateral Near:     Physical Exam Vitals and nursing note reviewed.  Constitutional:      General: He is not in acute distress.    Appearance: Normal appearance.  HENT:     Head: Normocephalic.  Eyes:     Extraocular Movements: Extraocular movements intact.     Pupils: Pupils are equal, round, and reactive to light.  Pulmonary:     Effort: Pulmonary effort is normal.  Musculoskeletal:     Cervical back: Normal range of motion.  Skin:    General: Skin is warm and dry.     Comments: Erythematous patch noted to the right side/abdomen. Area is flat, with central punctum.  There is no oozing, fluctuance, or drainage present.  Neurological:     General: No focal deficit present.     Mental Status: He is alert and oriented to person, place, and time.  Psychiatric:        Mood and Affect: Mood normal.        Behavior: Behavior normal.      UC Treatments / Results  Labs (all labs ordered are listed, but only abnormal results are displayed) Labs Reviewed - No data to display  EKG   Radiology No results found.  Procedures Procedures (including critical care time)  Medications Ordered in UC Medications - No data to display  Initial Impression / Assessment and Plan / UC Course  I have reviewed the triage vital signs and the nursing notes.  Pertinent labs & imaging results that were available during my care of the patient were reviewed by me and considered in my medical decision making (see chart for details).  The patient is well-appearing, he is in no acute distress, vital signs are stable.  Will treat rash/insect bite with triamcinolone cream 0.1%.  Patient was given supportive care recommendations to include  over-the-counter Zyrtec daily to help with itching, cool cloths or compresses to the area for itching or discomfort, and to avoid scratching or rubbing the areas while symptoms persist.  Patient was in agreement with this plan of care and verbalizes understanding.  All questions were answered.  Patient stable for discharge.  Final Clinical Impressions(s) / UC Diagnoses   Final diagnoses:  Rash and nonspecific skin eruption     Discharge Instructions      Take medication as prescribed. May also take over-the-counter Zyrtec daily to help with itching. Avoid hot baths or showers while symptoms persist.  Recommend taking lukewarm baths. May apply cool cloths to the area to help with itching or discomfort. Avoid scratching, rubbing, or manipulating the areas while symptoms persist. Recommend Aveeno colloidal oatmeal bath to use to help with drying and itching. Follow up if symptoms do not improve.      ED Prescriptions     Medication Sig Dispense Auth. Provider   triamcinolone cream (KENALOG) 0.1 % Apply 1 Application topically 2 (two) times daily. 30 g Abigael Mogle-Warren, Sadie Haber, NP      PDMP not reviewed this encounter.   Abran Cantor, NP 07/10/22 1929

## 2022-09-03 ENCOUNTER — Ambulatory Visit
Admission: EM | Admit: 2022-09-03 | Discharge: 2022-09-03 | Disposition: A | Discharge status: Home / Self Care | Payer: PRIVATE HEALTH INSURANCE | Attending: Family Medicine | Admitting: Family Medicine

## 2022-09-03 DIAGNOSIS — B9789 Other viral agents as the cause of diseases classified elsewhere: Secondary | ICD-10-CM | POA: Diagnosis not present

## 2022-09-03 DIAGNOSIS — J069 Acute upper respiratory infection, unspecified: Secondary | ICD-10-CM | POA: Diagnosis not present

## 2022-09-03 DIAGNOSIS — U071 COVID-19: Secondary | ICD-10-CM | POA: Insufficient documentation

## 2022-09-03 MED ORDER — PROMETHAZINE-DM 6.25-15 MG/5ML PO SYRP
5.0000 mL | ORAL_SOLUTION | Freq: Four times a day (QID) | ORAL | 0 refills | Status: DC | PRN
Start: 2022-09-03 — End: 2023-01-10

## 2022-09-03 MED ORDER — FLUTICASONE PROPIONATE 50 MCG/ACT NA SUSP: 1.0000 | Freq: Two times a day (BID) | NASAL | 2 refills | Status: DC

## 2022-09-03 NOTE — ED Triage Notes (Signed)
Pt reports he has nasal congestion, sore throat, body aches, joint aches, fever, chills, sweating x 1 day.   Took tylenol sinus

## 2022-09-03 NOTE — ED Provider Notes (Signed)
[facility]    CSN: [id number] Arrival date & time: 09/03/22  1533      History   Chief Complaint No chief complaint on file.   HPI Jonathan Mckay is a 27 y.o. male.   Presenting today with 1 day history of congestion, sore throat, body aches, fever, chills, sweats.  Denies cough, chest pain, shortness of breath, abdominal pain, nausea vomiting or diarrhea.  So far trying Tylenol with minimal relief.  No known sick contacts recently.    Past Medical History:  Diagnosis Date   Eosinophilic esophagitis    GERD (gastroesophageal reflux disease)    History of Clostridium difficile    2012   Nasal congestion    Non-productive cough    Right knee meniscal tear    Sinusitis, acute     Patient Active Problem List   Diagnosis Date Noted   S/P right knee arthroscopy 05/26/2013   Eosinophilic esophagitis 09/08/2012   Difficulty swallowing 09/05/2012   Abdominal pain    C. difficile diarrhea    Blood in stool     Past Surgical History:  Procedure Laterality Date   CLEFT PALATE REPAIR  age 22 mon old  &  64 mon old   and cleft lip repair   DIRECT LARYNGOSCOPY  08-21-2006   W/  ESOPHAGOSCOPY AND BRONCHOSCOPY AND REMOVAL FORGEIN BODY   ESOPHAGOGASTRODUODENOSCOPY N/A 06/20/2014   Procedure: ESOPHAGOGASTRODUODENOSCOPY (EGD);  Surgeon: Malissa Hippo, MD;  Location: [facility];  Service: Endoscopy;  Laterality: N/A;  200   ESOPHAGOSCOPY N/A 09/05/2012   Procedure: ESOPHAGOSCOPY Foreign Body Removal;  Surgeon: Jon Gills, MD;  Location: [facility] OR;  Service: Gastroenterology;  Laterality: N/A;   jaw Right    plate to right lower jaw due to fx   KNEE ARTHROSCOPY Right 05/26/2013   Procedure: ARTHROSCOPY RIGHT KNEE WITH DEBRIDEMENT  PARTIAL MENISECTOMY REMOVAL OF FOREIGN BODY ;  Surgeon: Eugenia Mcalpine, MD;  Location: [facility];  Service: Orthopedics;  Laterality: Right;       Home Medications    Prior to Admission medications   Medication Sig Start  Date End Date Taking? Authorizing Provider  fluticasone (FLONASE) 50 MCG/ACT nasal spray Place 1 spray into both nostrils 2 (two) times daily. 09/03/22  Yes Particia Nearing, PA-C  promethazine-dextromethorphan (PROMETHAZINE-DM) 6.25-15 MG/5ML syrup Take 5 mLs by mouth 4 (four) times daily as needed. 09/03/22  Yes Particia Nearing, PA-C  benzonatate (TESSALON) 100 MG capsule Take 1 capsule (100 mg total) by mouth 3 (three) times daily as needed for cough. Do not take with alcohol or while driving or operating heavy machinery.  May cause drowsiness. 01/19/22   Valentino Nose, NP  esomeprazole (NEXIUM) 20 MG capsule Take 20 mg by mouth daily at 12 noon.    [provider]  loratadine (CLARITIN) 10 MG tablet Take 10 mg by mouth daily.    [provider]  triamcinolone cream (KENALOG) 0.1 % Apply 1 Application topically 2 (two) times daily. 07/10/22   Leath-Warren, Sadie Haber, NP    Family History Family History  Problem Relation Age of Onset   Stroke Other    Asthma Other    Cancer Other    Inflammatory bowel disease Neg Hx     Social History Social History   Tobacco Use   Smoking status: Some Days    Types: Cigarettes    Passive exposure: Never   Smokeless tobacco: Never   Tobacco comments:  Pt states that he smokes ciggs and vapes some days  Vaping Use   Vaping status: Some Days  Substance Use Topics   Alcohol use: Yes    Comment: occasionally   Drug use: No     Allergies   Amoxicillin, Penicillins, Shellfish allergy, Clindamycin/lincomycin, Vancomycin, and Benadryl [diphenhydramine hcl]   Review of Systems Review of Systems Per HPI  Physical Exam Triage Vital Signs ED Triage Vitals  Encounter Vitals Group     BP 09/03/22 1545 117/61     Systolic BP Percentile --      Diastolic BP Percentile --      Pulse Rate 09/03/22 1545 96     Resp 09/03/22 1545 18     Temp 09/03/22 1545 99 F (37.2 C)     Temp Source 09/03/22 1545 Oral      SpO2 09/03/22 1545 99 %     Weight --      Height --      Head Circumference --      Peak Flow --      Pain Score 09/03/22 1546 5     Pain Loc --      Pain Education --      Exclude from Growth Chart --    No data found.  Updated Vital Signs BP 117/61 (BP Location: Right Arm)   Pulse 96   Temp 99 F (37.2 C) (Oral)   Resp 18   SpO2 99%   Visual Acuity Right Eye Distance:   Left Eye Distance:   Bilateral Distance:    Right Eye Near:   Left Eye Near:    Bilateral Near:     Physical Exam Vitals and nursing note reviewed.  Constitutional:      Appearance: He is well-developed.  HENT:     Head: Atraumatic.     Right Ear: External ear normal.     Left Ear: External ear normal.     Nose: Rhinorrhea present.     Mouth/Throat:     Pharynx: Posterior oropharyngeal erythema present. No oropharyngeal exudate.  Eyes:     Conjunctiva/sclera: Conjunctivae normal.     Pupils: Pupils are equal, round, and reactive to light.  Cardiovascular:     Rate and Rhythm: Normal rate and regular rhythm.  Pulmonary:     Effort: Pulmonary effort is normal. No respiratory distress.     Breath sounds: No wheezing or rales.  Musculoskeletal:        General: Normal range of motion.     Cervical back: Normal range of motion and neck supple.  Lymphadenopathy:     Cervical: No cervical adenopathy.  Skin:    General: Skin is warm and dry.  Neurological:     Mental Status: He is alert and oriented to person, place, and time.  Psychiatric:        Behavior: Behavior normal.      UC Treatments / Results  Labs (all labs ordered are listed, but only abnormal results are displayed) Labs Reviewed  SARS CORONAVIRUS 2 (TAT 6-24 HRS)    EKG   Radiology No results found.  Procedures Procedures (including critical care time)  Medications Ordered in UC Medications - No data to display  Initial Impression / Assessment and Plan / UC Course  I have reviewed the triage vital signs and the  nursing notes.  Pertinent labs & imaging results that were available during my care of the patient were reviewed by me and considered in my medical decision  making (see chart for details).     Vitals and exam reassuring, suspect viral upper respiratory infection likely COVID-19.  COVID testing pending.  Treat with Phenergan DM, Flonase, supportive over-the-counter medications and home care.  Return for worsening symptoms.  Final Clinical Impressions(s) / UC Diagnoses   Final diagnoses:  Viral URI   Discharge Instructions   None    ED Prescriptions     Medication Sig Dispense Auth. Provider   promethazine-dextromethorphan (PROMETHAZINE-DM) 6.25-15 MG/5ML syrup Take 5 mLs by mouth 4 (four) times daily as needed. 100 mL Particia Nearing, PA-C   fluticasone [facility]) 50 MCG/ACT nasal spray Place 1 spray into both nostrils 2 (two) times daily. 16 g Particia Nearing, [place]      PDMP not reviewed this encounter.   Particia Nearing, [place] 09/03/22 [phone]

## 2022-10-27 ENCOUNTER — Ambulatory Visit
Admission: EM | Admit: 2022-10-27 | Discharge: 2022-10-27 | Disposition: A | Discharge status: Home / Self Care | Payer: PRIVATE HEALTH INSURANCE | Attending: Nurse Practitioner | Admitting: Nurse Practitioner

## 2022-10-27 DIAGNOSIS — M79675 Pain in left toe(s): Secondary | ICD-10-CM

## 2022-10-27 DIAGNOSIS — S90425A Blister (nonthermal), left lesser toe(s), initial encounter: Secondary | ICD-10-CM | POA: Diagnosis not present

## 2022-10-27 MED ORDER — MUPIROCIN 2 % EX OINT: 1.0000 | TOPICAL_OINTMENT | Freq: Two times a day (BID) | CUTANEOUS | 0 refills | Status: AC

## 2022-10-27 NOTE — Discharge Instructions (Addendum)
Apply medication as prescribed. May take over-the-counter Tylenol or ibuprofen as needed for pain, fever, or general discomfort. Cleanse the affected area 2-3 times daily with an antibacterial soap such as Dial gold bar soap. Cover the toe with a bandage or gauze for added protection and to decrease irritation. Wear comfortable shoes. Continue Epsom salt soaks as needed for pain, swelling, and irritation. If symptoms fail to improve with this treatment, you may follow-up in this clinic or with your primary care physician for further evaluation. Follow-up as needed.

## 2022-10-27 NOTE — ED Provider Notes (Signed)
[facility]    CSN: [id number] Arrival date & time: 10/27/22  1444      History   Chief Complaint No chief complaint on file.   HPI Jonathan Mckay is a 27 y.o. male.   The history is provided by the patient.   Patient presents for complaints of pain and swelling to the left third toe.  Symptoms have been present for the past 3 days.  Patient states that pain has worsened over the past 24 hours.  He states that he has increased pain when he is ambulating on the right foot.  Patient denies fever, chills, chest pain, abdominal pain, nausea, vomiting, or diarrhea.  Patient states that he did notice some "yellow" drainage on his sock 1 day ago.  Patient reports he is not taking any medication for his symptoms.  States that he did soak the left foot in Epsom salt last evening.  Past Medical History:  Diagnosis Date   Eosinophilic esophagitis    GERD (gastroesophageal reflux disease)    History of Clostridium difficile    2012   Nasal congestion    Non-productive cough    Right knee meniscal tear    Sinusitis, acute     Patient Active Problem List   Diagnosis Date Noted   S/P right knee arthroscopy 05/26/2013   Eosinophilic esophagitis 09/08/2012   Difficulty swallowing 09/05/2012   Abdominal pain    C. difficile diarrhea    Blood in stool     Past Surgical History:  Procedure Laterality Date   CLEFT PALATE REPAIR  age 62 mon old  &  43 mon old   and cleft lip repair   DIRECT LARYNGOSCOPY  08-21-2006   W/  ESOPHAGOSCOPY AND BRONCHOSCOPY AND REMOVAL FORGEIN BODY   ESOPHAGOGASTRODUODENOSCOPY N/A 06/20/2014   Procedure: ESOPHAGOGASTRODUODENOSCOPY (EGD);  Surgeon: Malissa Hippo, MD;  Location: [facility];  Service: Endoscopy;  Laterality: N/A;  200   ESOPHAGOSCOPY N/A 09/05/2012   Procedure: ESOPHAGOSCOPY Foreign Body Removal;  Surgeon: Jon Gills, MD;  Location: [facility] OR;  Service: Gastroenterology;  Laterality: N/A;   jaw Right    plate to right lower jaw due  to fx   KNEE ARTHROSCOPY Right 05/26/2013   Procedure: ARTHROSCOPY RIGHT KNEE WITH DEBRIDEMENT  PARTIAL MENISECTOMY REMOVAL OF FOREIGN BODY ;  Surgeon: Eugenia Mcalpine, MD;  Location: [facility] ;  Service: Orthopedics;  Laterality: Right;       Home Medications    Prior to Admission medications   Medication Sig Start Date End Date Taking? Authorizing Provider  mupirocin ointment (BACTROBAN) 2 % Apply 1 Application topically 2 (two) times daily for 14 days. 10/27/22 11/10/22 Yes Darianne Muralles-Warren, Sadie Haber, NP  benzonatate (TESSALON) 100 MG capsule Take 1 capsule (100 mg total) by mouth 3 (three) times daily as needed for cough. Do not take with alcohol or while driving or operating heavy machinery.  May cause drowsiness. 01/19/22   Valentino Nose, NP  esomeprazole (NEXIUM) 20 MG capsule Take 20 mg by mouth daily at 12 noon.    [provider]  fluticasone (FLONASE) 50 MCG/ACT nasal spray Place 1 spray into both nostrils 2 (two) times daily. 09/03/22   Particia Nearing, PA-C  loratadine (CLARITIN) 10 MG tablet Take 10 mg by mouth daily.    [provider]  promethazine-dextromethorphan (PROMETHAZINE-DM) 6.25-15 MG/5ML syrup Take 5 mLs by mouth 4 (four) times daily as needed. 09/03/22   Particia Nearing, PA-C  triamcinolone  cream (KENALOG) 0.1 % Apply 1 Application topically 2 (two) times daily. 07/10/22   Anavictoria Wilk-Warren, Sadie Haber, NP    Family History Family History  Problem Relation Age of Onset   Stroke Other    Asthma Other    Cancer Other    Inflammatory bowel disease Neg Hx     Social History Social History   Tobacco Use   Smoking status: Some Days    Types: Cigarettes    Passive exposure: Never   Smokeless tobacco: Never   Tobacco comments:    Pt states that he smokes ciggs and vapes some days  Vaping Use   Vaping status: Some Days  Substance Use Topics   Alcohol use: Yes    Comment: occasionally   Drug use: No     Allergies    Amoxicillin, Penicillins, Shellfish allergy, Clindamycin/lincomycin, Vancomycin, and Benadryl [diphenhydramine hcl]   Review of Systems Review of Systems Per HPI  Physical Exam Triage Vital Signs ED Triage Vitals [10/27/22 1527]  Encounter Vitals Group     BP (!) 128/5     Systolic BP Percentile      Diastolic BP Percentile      Pulse Rate 73     Resp 14     Temp 98.7 F (37.1 C)     Temp Source Oral     SpO2 97 %     Weight      Height      Head Circumference      Peak Flow      Pain Score 5     Pain Loc      Pain Education      Exclude from Growth Chart    No data found.  Updated Vital Signs BP (!) 128/5 (BP Location: Right Arm)   Pulse 73   Temp 98.7 F (37.1 C) (Oral)   Resp 14   SpO2 97%   Visual Acuity Right Eye Distance:   Left Eye Distance:   Bilateral Distance:    Right Eye Near:   Left Eye Near:    Bilateral Near:     Physical Exam Vitals and nursing note reviewed.  Constitutional:      General: He is not in acute distress.    Appearance: Normal appearance.  HENT:     Head: Normocephalic.  Eyes:     Extraocular Movements: Extraocular movements intact.     Pupils: Pupils are equal, round, and reactive to light.  Pulmonary:     Effort: Pulmonary effort is normal.  Musculoskeletal:     Cervical back: Normal range of motion.  Feet:     Left foot:     Skin integrity: Blister (left third toe.  Open area noted to the left third toe with central punctum.) and erythema (left third toe) present. No ulcer or skin breakdown.     Toenail Condition: Left toenails are normal.  Lymphadenopathy:     Cervical: No cervical adenopathy.  Skin:    General: Skin is warm and dry.  Neurological:     General: No focal deficit present.     Mental Status: He is alert and oriented to person, place, and time.  Psychiatric:        Mood and Affect: Mood normal.        Behavior: Behavior normal.      UC Treatments / Results  Labs (all labs ordered are  listed, but only abnormal results are displayed) Labs Reviewed - No data to display  EKG  Radiology No results found.  Procedures Procedures (including critical care time)  Medications Ordered in UC Medications - No data to display  Initial Impression / Assessment and Plan / UC Course  I have reviewed the triage vital signs and the nursing notes.  Pertinent labs & imaging results that were available during my care of the patient were reviewed by me and considered in my medical decision making (see chart for details).  The patient is well-appearing, he is in no acute distress, vital signs are stable.  Patient with pain and swelling to the left third toe.  Symptoms have been present for the past 3 days.  On exam, patient does have erythema and mild swelling to the joint of the left toe.  There is an open area on the top of the toe which appears to have been a blister.  Will provide treatment with mupirocin 2% ointment.  Supportive care recommendations were provided and discussed with the patient to include over-the-counter analgesics, continue warm Epsom salt soaks, and padding the toe for additional protection.  Patient was advised to follow-up if symptoms fail to improve with this treatment.  Patient is in agreement with this plan of care and verbalizes understanding.  All questions were answered.  Patient stable for discharge.  Work note was provided.   Final Clinical Impressions(s) / UC Diagnoses   Final diagnoses:  Pain in toe of left foot  Blister of third toe of left foot, initial encounter     Discharge Instructions      Apply medication as prescribed. May take over-the-counter Tylenol or ibuprofen as needed for pain, fever, or general discomfort. Cleanse the affected area 2-3 times daily with an antibacterial soap such as Dial gold bar soap. Cover the toe with a bandage or gauze for added protection and to decrease irritation. Wear comfortable shoes. Continue Epsom  salt soaks as needed for pain, swelling, and irritation. If symptoms fail to improve with this treatment, you may follow-up in this clinic or with your primary care physician for further evaluation. Follow-up as needed.     ED Prescriptions     Medication Sig Dispense Auth. Provider   mupirocin ointment (BACTROBAN) 2 % Apply 1 Application topically 2 (two) times daily for 14 days. 22 g Matea Stanard-Warren, Sadie Haber, NP      PDMP not reviewed this encounter.   Abran Cantor, NP 10/27/22 1743

## 2022-10-27 NOTE — ED Triage Notes (Signed)
Pt c/o 3rd toe pain, red spot on the toe, swelling irritated x 3 days, noticed in the middle of the night from the pain of the toe. Hard to bare weight on that to

## 2023-01-10 ENCOUNTER — Emergency Department (HOSPITAL_COMMUNITY)
Admission: EM | Admit: 2023-01-10 | Discharge: 2023-01-10 | Disposition: A | Discharge status: Home / Self Care | Payer: PRIVATE HEALTH INSURANCE | Attending: Emergency Medicine | Admitting: Emergency Medicine

## 2023-01-10 ENCOUNTER — Encounter (HOSPITAL_COMMUNITY): Payer: Self-pay

## 2023-01-10 ENCOUNTER — Other Ambulatory Visit: Payer: Self-pay

## 2023-01-10 DIAGNOSIS — R110 Nausea: Secondary | ICD-10-CM | POA: Diagnosis not present

## 2023-01-10 DIAGNOSIS — K219 Gastro-esophageal reflux disease without esophagitis: Secondary | ICD-10-CM | POA: Insufficient documentation

## 2023-01-10 DIAGNOSIS — R109 Unspecified abdominal pain: Secondary | ICD-10-CM | POA: Diagnosis present

## 2023-01-10 DIAGNOSIS — R1013 Epigastric pain: Secondary | ICD-10-CM | POA: Insufficient documentation

## 2023-01-10 LAB — COMPREHENSIVE METABOLIC PANEL
ALT: 14 U/L (ref 0–44)
AST: 18 U/L (ref 15–41)
Albumin: 4.3 g/dL (ref 3.5–5.0)
Alkaline Phosphatase: 50 U/L (ref 38–126)
Anion gap: 8 (ref 5–15)
BUN: 11 mg/dL (ref 6–20)
CO2: 24 mmol/L (ref 22–32)
Calcium: 8.9 mg/dL (ref 8.9–10.3)
Chloride: 105 mmol/L (ref 98–111)
Creatinine, Ser: 0.88 mg/dL (ref 0.61–1.24)
GFR, Estimated: >60 mL/min (ref >60)
Glucose, Bld: 123 mg/dL — ABNORMAL HIGH (ref 70–99)
Potassium: 3.9 mmol/L (ref 3.5–5.1)
Sodium: 137 mmol/L (ref 135–145)
Total Bilirubin: 1.1 mg/dL (ref <1.2)
Total Protein: 6.8 g/dL (ref 6.5–8.1)

## 2023-01-10 LAB — URINALYSIS, ROUTINE W REFLEX MICROSCOPIC
Bilirubin Urine: NEGATIVE
Glucose, UA: NEGATIVE mg/dL
Hgb urine dipstick: NEGATIVE
Ketones, ur: NEGATIVE mg/dL
Leukocytes,Ua: NEGATIVE
Nitrite: NEGATIVE
Protein, ur: NEGATIVE mg/dL
Specific Gravity, Urine: 1.011 (ref 1.005–1.030)
pH: 7 (ref 5.0–8.0)

## 2023-01-10 LAB — CBC
HCT: 43.4 % (ref 39.0–52.0)
Hemoglobin: 15.2 g/dL (ref 13.0–17.0)
MCH: 30 pg (ref 26.0–34.0)
MCHC: 35 g/dL (ref 30.0–36.0)
MCV: 85.6 fL (ref 80.0–100.0)
Platelets: 228 10*3/uL (ref 150–400)
RBC: 5.07 MIL/uL (ref 4.22–5.81)
RDW: 12.5 % (ref 11.5–15.5)
WBC: 5.1 10*3/uL (ref 4.0–10.5)
nRBC: 0 % (ref 0.0–0.2)

## 2023-01-10 LAB — LIPASE, BLOOD: Lipase: 31 U/L (ref 11–51)

## 2023-01-10 MED ORDER — ALUM & MAG HYDROXIDE-SIMETH 200-200-20 MG/5ML PO SUSP
30.0000 mL | Freq: Once | ORAL | Status: AC
  Administered 2023-01-10: 30 mL via ORAL
  Filled 2023-01-10: qty 30

## 2023-01-10 MED ORDER — PANTOPRAZOLE SODIUM 40 MG PO TBEC
40.0000 mg | DELAYED_RELEASE_TABLET | Freq: Once | ORAL | Status: AC
Start: 2023-01-10 — End: 2023-01-10
  Administered 2023-01-10: 40 mg via ORAL
  Filled 2023-01-10: qty 1

## 2023-01-10 MED ORDER — SUCRALFATE 1 G PO TABS: 1.0000 g | ORAL_TABLET | Freq: Three times a day (TID) | ORAL | 0 refills | Status: AC

## 2023-01-10 MED ORDER — ESOMEPRAZOLE MAGNESIUM 40 MG PO CPDR
40.0000 mg | DELAYED_RELEASE_CAPSULE | Freq: Every day | ORAL | 2 refills | Status: AC | PRN
Start: 2023-01-10 — End: ?

## 2023-01-10 MED ORDER — FAMOTIDINE 20 MG PO TABS
20.0000 mg | ORAL_TABLET | Freq: Once | ORAL | Status: AC
  Administered 2023-01-10: 20 mg via ORAL
  Filled 2023-01-10: qty 1

## 2023-01-10 MED ORDER — LIDOCAINE VISCOUS HCL 2 % MT SOLN
15.0000 mL | Freq: Once | OROMUCOSAL | Status: AC
  Administered 2023-01-10: 15 mL via ORAL
  Filled 2023-01-10: qty 15

## 2023-01-10 MED ORDER — FAMOTIDINE 20 MG PO TABS: 20.0000 mg | ORAL_TABLET | Freq: Two times a day (BID) | ORAL | 0 refills | Status: AC | PRN

## 2023-01-10 NOTE — ED Notes (Signed)
Pt aware we need urine sample.  

## 2023-01-10 NOTE — ED Provider Notes (Signed)
EMERGENCY DEPARTMENT AT [facility] Provider Note   CSN: [id number] Arrival date & time: 01/10/23  1424     History  Chief Complaint  Patient presents with   Abdominal Pain    Jonathan Mckay is a 27 y.o. male.  With history of eosinophilic esophagitis, GERD presenting to the ED for evaluation of abdominal pain.  Localized to the epigastric region.  Does not radiate.  Described as a burning sensation.  Pain began approximately 3 days ago and has kept him up at night.  States he has not been taking his Nexium because he ran out.  He reports some nausea and difficulty eating secondary to the pain.  No fevers or chills.  No vomiting.  No urinary complaints.  No chest pain or shortness of breath.  States his symptoms began shortly after he started using Zyn pouches.   Abdominal Pain Associated symptoms: nausea        Home Medications Prior to Admission medications   Medication Sig Start Date End Date Taking? Authorizing Provider  famotidine (PEPCID) 20 MG tablet Take 1 tablet (20 mg total) by mouth 2 (two) times daily as needed for heartburn or indigestion. 01/10/23  Yes Jacquelyn Shadrick, Edsel Petrin, PA-C  omeprazole (PRILOSEC OTC) 20 MG tablet Take 20 mg by mouth daily as needed (acid reflux).   Yes [provider]  sucralfate (CARAFATE) 1 g tablet Take 1 tablet (1 g total) by mouth 4 (four) times daily -  with meals and at bedtime for 5 days. 01/10/23 01/15/23 Yes Stormey Wilborn, Edsel Petrin, PA-C  esomeprazole (NEXIUM) 40 MG capsule Take 1 capsule (40 mg total) by mouth daily as needed (acid reflux). 01/10/23   Torryn Fiske, Edsel Petrin, PA-C      Allergies    Amoxil [amoxicillin], Penicillins, Shellfish allergy, Clindamycin/lincomycin, Firvanq [vancomycin], and Benadryl [diphenhydramine hcl]    Review of Systems   Review of Systems  Gastrointestinal:  Positive for abdominal pain and nausea.  All other systems reviewed and are negative.   Physical Exam Updated Vital  Signs BP 136/71 (BP Location: Right Arm)   Pulse (!) 53   Temp 97.8 F (36.6 C) (Oral)   Resp 14   Ht 5\' 11"  (1.803 m)   Wt 74.8 kg   SpO2 100%   BMI 23.01 kg/m  Physical Exam Vitals and nursing note reviewed.  Constitutional:      General: He is not in acute distress.    Appearance: He is well-developed.     Comments: Resting comfortably in bed  HENT:     Head: Normocephalic and atraumatic.  Eyes:     Conjunctiva/sclera: Conjunctivae normal.  Cardiovascular:     Rate and Rhythm: Normal rate and regular rhythm.     Heart sounds: No murmur heard. Pulmonary:     Effort: Pulmonary effort is normal. No respiratory distress.     Breath sounds: Normal breath sounds. No wheezing, rhonchi or rales.  Abdominal:     General: Abdomen is flat.     Palpations: Abdomen is soft.     Tenderness: There is abdominal tenderness (Mild) in the epigastric area.  Musculoskeletal:        General: No swelling.     Cervical back: Neck supple.  Skin:    General: Skin is warm and dry.     Capillary Refill: Capillary refill takes less than 2 seconds.  Neurological:     Mental Status: He is alert.  Psychiatric:  Mood and Affect: Mood normal.     ED Results / Procedures / Treatments   Labs (all labs ordered are listed, but only abnormal results are displayed) Labs Reviewed  COMPREHENSIVE METABOLIC PANEL - Abnormal; Notable for the following components:      Result Value   Glucose, Bld 123 (*)    All other components within normal limits  LIPASE, BLOOD  CBC  URINALYSIS, ROUTINE W REFLEX MICROSCOPIC    EKG None  Radiology No results found.  Procedures Procedures    Medications Ordered in ED Medications  famotidine (PEPCID) tablet 20 mg (20 mg Oral Given 01/10/23 1603)  alum & mag hydroxide-simeth (MAALOX/MYLANTA) 200-200-20 MG/5ML suspension 30 mL (30 mLs Oral Given 01/10/23 1604)    And  lidocaine (XYLOCAINE) 2 % viscous mouth solution 15 mL (15 mLs Oral Given 01/10/23  1604)  pantoprazole (PROTONIX) EC tablet 40 mg (40 mg Oral Given 01/10/23 1604)    ED Course/ Medical Decision Making/ A&P                                 Medical Decision Making Amount and/or Complexity of Data Reviewed Labs: ordered.  This patient presents to the ED for concern of abdominal pain, this involves an extensive number of treatment options, and is a complaint that carries with it a high risk of complications and morbidity. Differential diagnosis of epigastric pain includes: Functional or nonulcer dyspepsia, PUD, GERD, Gastritis, (NSAIDs, alcohol, stress, H. pylori, pernicious anemia), pancreatitis or pancreatic cancer, overeating indigestion (high-fat foods, coffee), drugs (aspirin, antibiotics (eg, macrolides, metronidazole), corticosteroids, digoxin, narcotics, theophylline), gastroparesis, lactose intolerance, malabsorption gastric cancer, parasitic infection, (Giardia, Strongyloides, Ascaris) cholelithiasis, choledocholithiasis, or cholangitis, ACS, pericarditis, pneumonia, abdominal hernia, pregnancy, intestinal ischemia, esophageal rupture, gastric volvulus, hepatitis.   My initial workup includes labs, symptom control  Additional history obtained from: Nursing notes from this visit.  I ordered, reviewed and interpreted labs which include: CBC, CMP, lipase.  No leukocytosis or anemia.  No electrolyte derangement or kidney dysfunction.  Normal lipase.  Afebrile, hemodynamically stable.  27 year old male presenting for evaluation of epigastric abdominal pain.  Described as a burning sensation.  Has an extensive history of GERD and has seen GI for this.  He has not been taking his Nexium as he ran out of his prescription recently.  Lab workup was reassuring today.  He appears well on physical exam.  Symptoms improved after treatment in the emergency department.  Overall suspect dyspepsia.  Will send prescription for Nexium and Carafate and Pepcid.  He was encouraged to follow-up  with GI.  He was given return precautions.  Stable at discharge.  At this time there does not appear to be any evidence of an acute emergency medical condition and the patient appears stable for discharge with appropriate outpatient follow up. Diagnosis was discussed with patient who verbalizes understanding of care plan and is agreeable to discharge. I have discussed return precautions with patient who verbalizes understanding. Patient encouraged to follow-up with their PCP within 1 week. All questions answered.  Note: Portions of this report may have been transcribed using voice recognition software. Every effort was made to ensure accuracy; however, inadvertent computerized transcription errors may still be present.        Final Clinical Impression(s) / ED Diagnoses Final diagnoses:  Epigastric abdominal pain    Rx / DC Orders ED Discharge Orders  Ordered    esomeprazole (NEXIUM) 40 MG capsule  Daily PRN        01/10/23 1656    famotidine (PEPCID) 20 MG tablet  2 times daily PRN        01/10/23 1656    sucralfate (CARAFATE) 1 g tablet  3 times daily with meals & bedtime        01/10/23 1656              Michelle Piper, PA-C 01/10/23 1658    Bethann Berkshire, MD 01/12/23 [phone]

## 2023-01-10 NOTE — ED Triage Notes (Signed)
Burning pain in ABD  Keeping pt up at night  Has been awake for 3 nights Hx of GERD

## 2023-01-10 NOTE — Discharge Instructions (Signed)
You have been seen today for your complaint of abdominal pain. Your lab work was reassuring. Your discharge medications include Nexium, take this daily.  Pepcid.  Take this as needed for pain.  Carafate.  Take this as prescribed and for the entire duration of the prescription. Home care instructions are as follows:  Avoid spicy, greasy, fried, acidic foods Follow up with: Your GI doctor for reevaluation Please seek immediate medical care if you develop any of the following symptoms: You have pain in your arms, neck, jaw, teeth, or back all of a sudden. You feel sweaty, dizzy, or light-headed all of a sudden. You faint. You have chest pain or shortness of breath. You cannot stop vomiting, or you vomit blood. Your poop (stool) is bloody or black. You have very bad pain in your belly. At this time there does not appear to be the presence of an emergent medical condition, however there is always the potential for conditions to change. Please read and follow the below instructions.  Do not take your medicine if  develop an itchy rash, swelling in your mouth or lips, or difficulty breathing; call 911 and seek immediate emergency medical attention if this occurs.  You may review your lab tests and imaging results in their entirety on your MyChart account.  Please discuss all results of fully with your primary care provider and other specialist at your follow-up visit.  Note: Portions of this text may have been transcribed using voice recognition software. Every effort was made to ensure accuracy; however, inadvertent computerized transcription errors may still be present.

## 2023-01-11 ENCOUNTER — Emergency Department (HOSPITAL_COMMUNITY): Payer: PRIVATE HEALTH INSURANCE

## 2023-01-11 ENCOUNTER — Emergency Department (HOSPITAL_COMMUNITY)
Admission: EM | Admit: 2023-01-11 | Discharge: 2023-01-11 | Disposition: A | Discharge status: Home / Self Care | Payer: PRIVATE HEALTH INSURANCE | Attending: Emergency Medicine | Admitting: Emergency Medicine

## 2023-01-11 ENCOUNTER — Encounter (HOSPITAL_COMMUNITY): Payer: Self-pay | Admitting: Emergency Medicine

## 2023-01-11 DIAGNOSIS — R1084 Generalized abdominal pain: Secondary | ICD-10-CM

## 2023-01-11 DIAGNOSIS — R1013 Epigastric pain: Secondary | ICD-10-CM | POA: Insufficient documentation

## 2023-01-11 DIAGNOSIS — F17210 Nicotine dependence, cigarettes, uncomplicated: Secondary | ICD-10-CM | POA: Diagnosis not present

## 2023-01-11 LAB — CBC
HCT: 44.2 % (ref 39.0–52.0)
Hemoglobin: 15.6 g/dL (ref 13.0–17.0)
MCH: 30 pg (ref 26.0–34.0)
MCHC: 35.3 g/dL (ref 30.0–36.0)
MCV: 85 fL (ref 80.0–100.0)
Platelets: 232 10*3/uL (ref 150–400)
RBC: 5.2 MIL/uL (ref 4.22–5.81)
RDW: 12.3 % (ref 11.5–15.5)
WBC: 5 10*3/uL (ref 4.0–10.5)
nRBC: 0 % (ref 0.0–0.2)

## 2023-01-11 LAB — COMPREHENSIVE METABOLIC PANEL
ALT: 14 U/L (ref 0–44)
AST: 15 U/L (ref 15–41)
Albumin: 4.5 g/dL (ref 3.5–5.0)
Alkaline Phosphatase: 54 U/L (ref 38–126)
Anion gap: 8 (ref 5–15)
BUN: 14 mg/dL (ref 6–20)
CO2: 26 mmol/L (ref 22–32)
Calcium: 9.5 mg/dL (ref 8.9–10.3)
Chloride: 105 mmol/L (ref 98–111)
Creatinine, Ser: 0.84 mg/dL (ref 0.61–1.24)
GFR, Estimated: >60 mL/min (ref >60)
Glucose, Bld: 111 mg/dL — ABNORMAL HIGH (ref 70–99)
Potassium: 4.3 mmol/L (ref 3.5–5.1)
Sodium: 139 mmol/L (ref 135–145)
Total Bilirubin: 0.7 mg/dL (ref <1.2)
Total Protein: 7 g/dL (ref 6.5–8.1)

## 2023-01-11 LAB — LIPASE, BLOOD: Lipase: 33 U/L (ref 11–51)

## 2023-01-11 MED ORDER — FAMOTIDINE IN NACL 20-0.9 MG/50ML-% IV SOLN
20.0000 mg | Freq: Once | INTRAVENOUS | Status: AC
  Administered 2023-01-11: 20 mg via INTRAVENOUS
  Filled 2023-01-11: qty 50

## 2023-01-11 MED ORDER — DICYCLOMINE HCL 20 MG PO TABS: 20.0000 mg | ORAL_TABLET | Freq: Two times a day (BID) | ORAL | 0 refills | Status: AC

## 2023-01-11 MED ORDER — IOHEXOL 300 MG/ML  SOLN
100.0000 mL | Freq: Once | INTRAMUSCULAR | Status: AC | PRN
  Administered 2023-01-11: 80 mL via INTRAVENOUS

## 2023-01-11 MED ORDER — MORPHINE SULFATE (PF) 4 MG/ML IV SOLN
4.0000 mg | Freq: Once | INTRAVENOUS | Status: AC
Start: 2023-01-11 — End: 2023-01-11
  Administered 2023-01-11: 4 mg via INTRAVENOUS
  Filled 2023-01-11: qty 1

## 2023-01-11 MED ORDER — KETOROLAC TROMETHAMINE 15 MG/ML IJ SOLN
15.0000 mg | Freq: Once | INTRAMUSCULAR | Status: AC
  Administered 2023-01-11: 15 mg via INTRAVENOUS
  Filled 2023-01-11: qty 1

## 2023-01-11 MED ORDER — ONDANSETRON 4 MG PO TBDP
4.0000 mg | ORAL_TABLET | Freq: Once | ORAL | Status: AC
  Administered 2023-01-11: 4 mg via ORAL
  Filled 2023-01-11: qty 1

## 2023-01-11 MED ORDER — POLYETHYLENE GLYCOL 3350 17 G PO PACK: 17.0000 g | PACK | Freq: Every day | ORAL | 1 refills | Status: AC

## 2023-01-11 MED ORDER — ALUM & MAG HYDROXIDE-SIMETH 200-200-20 MG/5ML PO SUSP
30.0000 mL | Freq: Once | ORAL | Status: AC
Start: 2023-01-11 — End: 2023-01-11
  Administered 2023-01-11: 30 mL via ORAL
  Filled 2023-01-11: qty 30

## 2023-01-11 NOTE — Discharge Instructions (Addendum)
You were evaluated in the Emergency Department and after careful evaluation, we did not find any emergent condition requiring admission or further testing in the hospital.  Your exam/testing today is overall reassuring.  Symptoms could be due to acid reflux, constipation, or splenomegaly or combination of all three.  Take the Nexium, Pepcid, and Carafate medications as prescribed.  These medicines are for your acid.  Take the MiraLAX daily to help clear any constipation.  Avoid injury to your spleen as we discussed and follow-up with your primary care doctor for any further testing.  Can use the Bentyl medication as needed for crampy abdominal pain especially at night.  Please return to the Emergency Department if you experience any worsening of your condition.   Thank you for allowing [place] to be a part of your care.

## 2023-01-11 NOTE — ED Provider Notes (Signed)
[facility] DE[facility] Emergency Department Provider Note MRN:  [id number]  Arrival date & time: 01/11/23     Chief Complaint   Abdominal Pain   History of Present Illness   Jonathan Mckay is a 27 y.o. year-old male with a history of GERD presenting to the ED with chief complaint of abdominal pain.  Epigastric abdominal pain with radiation to the chest as well as the lower abdomen.  Burning and worse when laying flat.  Has a long history of GERD but this feels worse.  Review of Systems  A thorough review of systems was obtained and all systems are negative except as noted in the HPI and PMH.   Patient's Health History    Past Medical History:  Diagnosis Date   Eosinophilic esophagitis    GERD (gastroesophageal reflux disease)    History of Clostridium difficile    2012   Nasal congestion    Non-productive cough    Right knee meniscal tear    Sinusitis, acute     Past Surgical History:  Procedure Laterality Date   CLEFT PALATE REPAIR  age 58 mon old  &  76 mon old   and cleft lip repair   DIRECT LARYNGOSCOPY  08-21-2006   W/  ESOPHAGOSCOPY AND BRONCHOSCOPY AND REMOVAL FORGEIN BODY   ESOPHAGOGASTRODUODENOSCOPY N/A 06/20/2014   Procedure: ESOPHAGOGASTRODUODENOSCOPY (EGD);  Surgeon: Malissa Hippo, MD;  Location: [facility];  Service: Endoscopy;  Laterality: N/A;  200   ESOPHAGOSCOPY N/A 09/05/2012   Procedure: ESOPHAGOSCOPY Foreign Body Removal;  Surgeon: Jon Gills, MD;  Location: [facility] OR;  Service: Gastroenterology;  Laterality: N/A;   jaw Right    plate to right lower jaw due to fx   KNEE ARTHROSCOPY Right 05/26/2013   Procedure: ARTHROSCOPY RIGHT KNEE WITH DEBRIDEMENT  PARTIAL MENISECTOMY REMOVAL OF FOREIGN BODY ;  Surgeon: Eugenia Mcalpine, MD;  Location: [facility];  Service: Orthopedics;  Laterality: Right;    Family History  Problem Relation Age of Onset   Stroke Other    Asthma Other    Cancer Other    Inflammatory bowel disease Neg  Hx     Social History   Socioeconomic History   Marital status: Single    Spouse name: Not on file   Number of children: Not on file   Years of education: Not on file   Highest education level: Not on file  Occupational History   Not on file  Tobacco Use   Smoking status: Some Days    Types: Cigarettes    Passive exposure: Never   Smokeless tobacco: Never   Tobacco comments:    Pt states that he smokes ciggs and vapes some days  Vaping Use   Vaping status: Some Days  Substance and Sexual Activity   Alcohol use: Yes    Comment: occasionally   Drug use: No   Sexual activity: Not Currently    Birth control/protection: None  Other Topics Concern   Not on file  Social History Narrative    NO ANESTHESIA ISSUES WITH PT      PT HAS ORTHODONTIC BRACES ON   Social Determinants of Health   Financial Resource Strain: Not on file  Food Insecurity: Not on file  Transportation Needs: Not on file  Physical Activity: Not on file  Stress: Not on file  Social Connections: Not on file  Intimate Partner Violence: Not on file     Physical Exam   Vitals:   01/11/23  0150 01/11/23 0330  BP: 134/89 130/81  Pulse: (!) 55 61  Resp: 16 17  Temp: 98.1 F (36.7 C)   SpO2: 100% 99%    CONSTITUTIONAL: Well-appearing, NAD NEURO/PSYCH:  Alert and oriented x 3, no focal deficits EYES:  eyes equal and reactive ENT/NECK:  no LAD, no JVD CARDIO: Regular rate, well-perfused, normal S1 and S2 PULM:  CTAB no wheezing or rhonchi GI/GU:  non-distended, non-tender MSK/SPINE:  No gross deformities, no edema SKIN:  no rash, atraumatic   *Additional and/or pertinent findings included in MDM below  Diagnostic and Interventional Summary    EKG Interpretation Date/Time:    Ventricular Rate:    PR Interval:    QRS Duration:    QT Interval:    QTC Calculation:   R Axis:      Text Interpretation:         Labs Reviewed  COMPREHENSIVE METABOLIC PANEL - Abnormal; Notable for the  following components:      Result Value   Glucose, Bld 111 (*)    All other components within normal limits  CBC  LIPASE, BLOOD    CT ABDOMEN PELVIS W CONTRAST  Final Result    DG Abdomen Acute W/Chest  Final Result      Medications  alum & mag hydroxide-simeth (MAALOX/MYLANTA) 200-200-20 MG/5ML suspension 30 mL (30 mLs Oral Given 01/11/23 0227)  ondansetron (ZOFRAN-ODT) disintegrating tablet 4 mg (4 mg Oral Given 01/11/23 0228)  morphine (PF) 4 MG/ML injection 4 mg (4 mg Intravenous Given 01/11/23 0424)  ketorolac (TORADOL) 15 MG/ML injection 15 mg (15 mg Intravenous Given 01/11/23 0424)  famotidine (PEPCID) IVPB 20 mg premix (0 mg Intravenous Stopped 01/11/23 0530)  iohexol (OMNIPAQUE) 300 MG/ML solution 100 mL (80 mLs Intravenous Contrast Given 01/11/23 0452)     Procedures  /  Critical Care Procedures  ED Course and Medical Decision Making  Initial Impression and Ddx Well-appearing 27 year old otherwise healthy, history of GERD presenting with symptoms that sound like GERD.  Other considerations include cholecystitis, gastric ulcer, pneumothorax.  Abdomen is nonsurgical, no rebound guarding or rigidity.  Obtaining labs, screening x-ray and will reassess.  Was here yesterday with similar symptoms.  Past medical/surgical history that increases complexity of ED encounter: GERD  Interpretation of Diagnostics I personally reviewed the laboratory assessment and my interpretation is as follows: No significant blood count or electrolyte disturbance  Plain film unremarkable, patient with continued pain different from his GERD pain and so CT obtained which, apart from splenomegaly, is unremarkable.  Patient Reassessment and Ultimate Disposition/Management     Patient continues to look and feel well with normal vital signs.  Denies history of viral illness and so doubt recent mononucleosis.  Encourage close follow-up for further testing with PCP regarding the splenomegaly, provided  education on splenic injury, appropriate for discharge.  Patient management required discussion with the following services or consulting groups:  None  Complexity of Problems Addressed Acute illness or injury that poses threat of life of bodily function  Additional Data Reviewed and Analyzed Further history obtained from: Prior ED visit notes and Prior labs/imaging results  Additional Factors Impacting ED Encounter Risk Prescriptions  Elmer Sow. Pilar Plate, MD [facility] Emergency Medicine [facility] [email or web address]  Final Clinical Impressions(s) / ED Diagnoses  No diagnosis found.  ED Discharge Orders          Ordered    polyethylene glycol (MIRALAX) 17 g packet  Daily        01/11/23 0541  dicyclomine (BENTYL) 20 MG tablet  2 times daily        01/11/23 0541             Discharge Instructions Discussed with and Provided to Patient:     Discharge Instructions      You were evaluated in the Emergency Department and after careful evaluation, we did not find any emergent condition requiring admission or further testing in the hospital.  Your exam/testing today is overall reassuring.  Symptoms could be due to acid reflux, constipation, or splenomegaly or combination of all three.  Take the Nexium, Pepcid, and Carafate medications as prescribed.  These medicines are for your acid.  Take the MiraLAX daily to help clear any constipation.  Avoid injury to your spleen as we discussed and follow-up with your primary care doctor for any further testing.  Can use the Bentyl medication as needed for crampy abdominal pain especially at night.  Please return to the Emergency Department if you experience any worsening of your condition.   Thank you for allowing [place] to be a part of your care.       Sabas Sous, MD 01/11/23 [phone]

## 2023-01-11 NOTE — ED Triage Notes (Signed)
Pt with c/o generalized abdominal pain. Was seen here earlier for same. States pain is worse when lying down.

## 2023-01-11 NOTE — ED Notes (Signed)
ED Provider at bedside. 

## 2023-08-09 ENCOUNTER — Encounter (INDEPENDENT_AMBULATORY_CARE_PROVIDER_SITE_OTHER): Payer: Self-pay | Admitting: *Deleted

## 2023-08-16 ENCOUNTER — Encounter (INDEPENDENT_AMBULATORY_CARE_PROVIDER_SITE_OTHER): Payer: Self-pay | Admitting: *Deleted

## 2023-08-23 ENCOUNTER — Ambulatory Visit (INDEPENDENT_AMBULATORY_CARE_PROVIDER_SITE_OTHER): Payer: PRIVATE HEALTH INSURANCE | Admitting: Gastroenterology

## 2023-09-06 ENCOUNTER — Encounter (INDEPENDENT_AMBULATORY_CARE_PROVIDER_SITE_OTHER): Payer: Self-pay | Admitting: *Deleted

## 2023-09-06 ENCOUNTER — Ambulatory Visit (INDEPENDENT_AMBULATORY_CARE_PROVIDER_SITE_OTHER): Payer: Self-pay | Admitting: Gastroenterology

## 2023-09-25 ENCOUNTER — Other Ambulatory Visit: Payer: Self-pay

## 2023-09-25 ENCOUNTER — Emergency Department (HOSPITAL_COMMUNITY)
Admission: EM | Admit: 2023-09-25 | Discharge: 2023-09-25 | Disposition: A | Discharge status: Home / Self Care | Payer: Self-pay | Attending: Student | Admitting: Student

## 2023-09-25 ENCOUNTER — Encounter (HOSPITAL_COMMUNITY): Payer: Self-pay | Admitting: Emergency Medicine

## 2023-09-25 DIAGNOSIS — S060X0D Concussion without loss of consciousness, subsequent encounter: Secondary | ICD-10-CM

## 2023-09-25 DIAGNOSIS — S0083XA Contusion of other part of head, initial encounter: Secondary | ICD-10-CM | POA: Insufficient documentation

## 2023-09-25 DIAGNOSIS — M25512 Pain in left shoulder: Secondary | ICD-10-CM | POA: Insufficient documentation

## 2023-09-25 DIAGNOSIS — S060X0A Concussion without loss of consciousness, initial encounter: Secondary | ICD-10-CM | POA: Insufficient documentation

## 2023-09-25 DIAGNOSIS — H1132 Conjunctival hemorrhage, left eye: Secondary | ICD-10-CM | POA: Insufficient documentation

## 2023-09-25 MED ORDER — OXYCODONE HCL 5 MG PO CAPS: 5.0000 mg | ORAL_CAPSULE | Freq: Four times a day (QID) | ORAL | 0 refills | Status: AC | PRN

## 2023-09-25 MED ORDER — NAPROXEN 500 MG PO TABS: 500.0000 mg | ORAL_TABLET | Freq: Two times a day (BID) | ORAL | 0 refills | Status: AC

## 2023-09-25 MED ORDER — METHOCARBAMOL 500 MG PO TABS: 500.0000 mg | ORAL_TABLET | Freq: Three times a day (TID) | ORAL | 0 refills | Status: AC | PRN

## 2023-09-25 NOTE — Nursing Note (Signed)
@  9489 - This RT was called to conscious sedation for this patient to monitor patient oxygen saturation and CO2 by CO2 monitor with 2L [place] for precautionary measures as the patient's O2 saturation was 98% on RA.  The patient maintained an O2 sat of 95% or above during the procedure.
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
## 2023-09-25 NOTE — ED Notes (Signed)
 Verbal permission given by patient to speak w/ sister, Kaitlyn. Answered family's questions, updated w/ plan of care. Family states verbal understanding and given opportunity to ask questions.

## 2023-09-25 NOTE — ED Notes (Signed)
EDPA Provider at bedside. 
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
## 2023-09-25 NOTE — ED Notes (Addendum)
 This RN assessed pt per request of staff. Pt neuro WDL and PEARRLA. Provider notified as well.

## 2023-09-25 NOTE — Discharge Instructions (Addendum)
 Can take naproxen  twice daily.  Take 1000 mg of Tylenol  every 8 hours.  Take oxycodone  for breakthrough pain do not drive or operate heavy machinery when taking this medicine.  Take Robaxin  for muscle spasm but do not drive or operate heavy machinery when taking this medicine.  Can also use ice over area of pain.  Follow-up with specialist from prior ED visit. Follow up with eye doctor for concussion.  Return to ER with new or worsening symptoms.
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
## 2023-09-25 NOTE — ED Notes (Signed)
 ED Provider at bedside.

## 2023-11-17 ENCOUNTER — Encounter (INDEPENDENT_AMBULATORY_CARE_PROVIDER_SITE_OTHER): Payer: Self-pay | Admitting: Gastroenterology
